# Patient Record
Sex: Female | Born: 1965 | Race: White | Hispanic: No | Marital: Married | Smoking: Former smoker
Health system: Southern US, Community
[De-identification: ages and names within clinical notes are randomized; demographics above are authoritative.]

## PROBLEM LIST (undated history)

## (undated) DIAGNOSIS — E785 Hyperlipidemia, unspecified: Secondary | ICD-10-CM

## (undated) DIAGNOSIS — G4730 Sleep apnea, unspecified: Secondary | ICD-10-CM

## (undated) DIAGNOSIS — Z8619 Personal history of other infectious and parasitic diseases: Secondary | ICD-10-CM

## (undated) DIAGNOSIS — E039 Hypothyroidism, unspecified: Secondary | ICD-10-CM

## (undated) DIAGNOSIS — R7303 Prediabetes: Secondary | ICD-10-CM

## (undated) DIAGNOSIS — R7989 Other specified abnormal findings of blood chemistry: Secondary | ICD-10-CM

## (undated) DIAGNOSIS — N951 Menopausal and female climacteric states: Secondary | ICD-10-CM

## (undated) DIAGNOSIS — Z87442 Personal history of urinary calculi: Secondary | ICD-10-CM

## (undated) DIAGNOSIS — T7840XA Allergy, unspecified, initial encounter: Secondary | ICD-10-CM

## (undated) DIAGNOSIS — Z9889 Other specified postprocedural states: Secondary | ICD-10-CM

## (undated) DIAGNOSIS — R112 Nausea with vomiting, unspecified: Secondary | ICD-10-CM

## (undated) DIAGNOSIS — F419 Anxiety disorder, unspecified: Secondary | ICD-10-CM

## (undated) DIAGNOSIS — L900 Lichen sclerosus et atrophicus: Secondary | ICD-10-CM

## (undated) HISTORY — PX: BREAST SURGERY: SHX581

## (undated) HISTORY — DX: Anxiety disorder, unspecified: F41.9

## (undated) HISTORY — PX: CHOLECYSTECTOMY: SHX55

## (undated) HISTORY — DX: Personal history of other infectious and parasitic diseases: Z86.19

## (undated) HISTORY — DX: Other specified abnormal findings of blood chemistry: R79.89

## (undated) HISTORY — DX: Menopausal and female climacteric states: N95.1

## (undated) HISTORY — PX: LITHOTRIPSY: SUR834

## (undated) HISTORY — DX: Prediabetes: R73.03

## (undated) HISTORY — DX: Lichen sclerosus et atrophicus: L90.0

## (undated) HISTORY — PX: AUGMENTATION MAMMAPLASTY: SUR837

## (undated) HISTORY — PX: COSMETIC SURGERY: SHX468

## (undated) HISTORY — DX: Hyperlipidemia, unspecified: E78.5

## (undated) HISTORY — DX: Allergy, unspecified, initial encounter: T78.40XA

## (undated) HISTORY — PX: WISDOM TOOTH EXTRACTION: SHX21

## (undated) HISTORY — DX: Personal history of urinary calculi: Z87.442

---

## 1997-12-16 ENCOUNTER — Other Ambulatory Visit: Admission: RE | Admit: 1997-12-16 | Discharge: 1997-12-16 | Payer: Self-pay | Admitting: Obstetrics and Gynecology

## 1998-12-23 ENCOUNTER — Other Ambulatory Visit: Admission: RE | Admit: 1998-12-23 | Discharge: 1998-12-23 | Payer: Self-pay | Admitting: Obstetrics and Gynecology

## 2002-08-02 ENCOUNTER — Other Ambulatory Visit: Admission: RE | Admit: 2002-08-02 | Discharge: 2002-08-02 | Payer: Self-pay | Admitting: Obstetrics and Gynecology

## 2003-02-16 HISTORY — PX: NOVASURE ABLATION: SHX5394

## 2003-08-22 ENCOUNTER — Other Ambulatory Visit: Admission: RE | Admit: 2003-08-22 | Discharge: 2003-08-22 | Payer: Self-pay | Admitting: Obstetrics and Gynecology

## 2003-12-06 ENCOUNTER — Ambulatory Visit (HOSPITAL_COMMUNITY): Admission: RE | Admit: 2003-12-06 | Discharge: 2003-12-06 | Payer: Self-pay | Admitting: Obstetrics and Gynecology

## 2003-12-06 ENCOUNTER — Encounter (INDEPENDENT_AMBULATORY_CARE_PROVIDER_SITE_OTHER): Payer: Self-pay | Admitting: *Deleted

## 2004-10-08 ENCOUNTER — Other Ambulatory Visit: Admission: RE | Admit: 2004-10-08 | Discharge: 2004-10-08 | Payer: Self-pay | Admitting: Obstetrics and Gynecology

## 2004-10-27 ENCOUNTER — Ambulatory Visit: Payer: Self-pay | Admitting: Internal Medicine

## 2004-11-06 ENCOUNTER — Ambulatory Visit: Payer: Self-pay | Admitting: Internal Medicine

## 2005-12-08 ENCOUNTER — Other Ambulatory Visit: Admission: RE | Admit: 2005-12-08 | Discharge: 2005-12-08 | Payer: Self-pay | Admitting: Obstetrics and Gynecology

## 2006-01-12 ENCOUNTER — Encounter: Admission: RE | Admit: 2006-01-12 | Discharge: 2006-01-12 | Payer: Self-pay | Admitting: Obstetrics and Gynecology

## 2007-02-24 ENCOUNTER — Encounter: Admission: RE | Admit: 2007-02-24 | Discharge: 2007-02-24 | Payer: Self-pay | Admitting: Obstetrics and Gynecology

## 2008-03-19 ENCOUNTER — Encounter: Admission: RE | Admit: 2008-03-19 | Discharge: 2008-03-19 | Payer: Self-pay | Admitting: Obstetrics and Gynecology

## 2008-06-10 ENCOUNTER — Ambulatory Visit (HOSPITAL_COMMUNITY): Admission: RE | Admit: 2008-06-10 | Discharge: 2008-06-10 | Payer: Self-pay | Admitting: Urology

## 2008-08-22 ENCOUNTER — Ambulatory Visit (HOSPITAL_COMMUNITY): Admission: RE | Admit: 2008-08-22 | Discharge: 2008-08-22 | Payer: Self-pay | Admitting: Urology

## 2008-11-01 ENCOUNTER — Ambulatory Visit (HOSPITAL_COMMUNITY): Admission: RE | Admit: 2008-11-01 | Discharge: 2008-11-01 | Payer: Self-pay | Admitting: Urology

## 2009-02-15 DIAGNOSIS — Z87442 Personal history of urinary calculi: Secondary | ICD-10-CM

## 2009-02-15 HISTORY — DX: Personal history of urinary calculi: Z87.442

## 2009-04-08 ENCOUNTER — Encounter: Admission: RE | Admit: 2009-04-08 | Discharge: 2009-04-08 | Payer: Self-pay | Admitting: Obstetrics and Gynecology

## 2009-09-23 ENCOUNTER — Encounter (INDEPENDENT_AMBULATORY_CARE_PROVIDER_SITE_OTHER): Payer: Self-pay | Admitting: *Deleted

## 2010-03-09 ENCOUNTER — Other Ambulatory Visit: Payer: Self-pay | Admitting: Obstetrics and Gynecology

## 2010-03-09 DIAGNOSIS — Z1239 Encounter for other screening for malignant neoplasm of breast: Secondary | ICD-10-CM

## 2010-03-09 DIAGNOSIS — Z1231 Encounter for screening mammogram for malignant neoplasm of breast: Secondary | ICD-10-CM

## 2010-03-17 NOTE — Letter (Signed)
Summary: Colonoscopy Letter  [facility]  [address] [place], [place] [address]   Phone: [phone]  Fax: [phone]      September 23, 2009 MRN: [id number]   [facility] [address] [place], [place]  [address]   Dear Terri Jensen,   According to your medical record, it is time for you to schedule a Colonoscopy. The American Cancer Society recommends this procedure as a method to detect early colon cancer. Patients with a family history of colon cancer, or a personal history of colon polyps or inflammatory bowel disease are at increased risk.  This letter has beeen generated based on the recommendations made at the time of your procedure. If you feel that in your particular situation this may no longer apply, please contact our office.  Please call our office at [phone] to schedule this appointment or to update your records at your earliest convenience.  Thank you for cooperating with [place] to provide you with the very best care possible.   Sincerely,  Hedwig Morton. Juanda Chance, M.D.  [facility] [phone]

## 2010-04-13 ENCOUNTER — Ambulatory Visit: Payer: Self-pay

## 2010-05-22 LAB — HEMOGLOBIN AND HEMATOCRIT, BLOOD: HCT: 38.4 % (ref 36.0–46.0)

## 2010-05-22 LAB — PREGNANCY, URINE: Preg Test, Ur: NEGATIVE

## 2010-05-24 LAB — PREGNANCY, URINE: Preg Test, Ur: NEGATIVE

## 2010-05-27 LAB — PREGNANCY, URINE: Preg Test, Ur: NEGATIVE

## 2010-07-03 NOTE — H&P (Signed)
Terri Jensen, Terri Jensen                  ACCOUNT NO.:  [id number]   MEDICAL RECORD NO.:  [id number]          PATIENT TYPE:  AMB   LOCATION:  SDC                           FACILITY:  [facility]   PHYSICIAN:  Juluis Mire, M.D.   DATE OF BIRTH:  07-25-1965   DATE OF ADMISSION:  12/06/2003  DATE OF DISCHARGE:                                HISTORY & PHYSICAL   The patient is A 45 year old gravida 2, para 2, married white female, who  presents for a hysteroscopy and Novasure ablation of the endometrium.   In relation to the present admission, cycles are regular.  They are  occurring approximately every 19 to 23 days.  She has one to two days of  extremely heavy flow that is very limiting from the patient's standpoint.  She does have clots and increasing dysmenorrhea.  It is of note her husband  has had a prior vasectomy.  A prior saline infusion ultrasound done on  September 6 was completely unremarkable with findings suggestive of  adenomyosis.  Alternatives have been discussed, including birth control  pills.  These do cause headaches for the patient.  We have discussed the  Mirena IUD versus endometrial ablation versus hysterectomy.  The patient  presents for ablation today.  We will do a hysteroscopic evaluation prior.   In terms of allergies, no known drug allergies.   Medications are none.   PAST MEDICAL HISTORY:  Usual childhood diseases, no significant sequelae.   PAST SURGICAL HISTORY:  Wisdom teeth extracted and previous cholecystectomy.   PAST OBSTETRICAL HISTORY:  She has had one vaginal delivery and one C-  section.   FAMILY HISTORY:  Noncontributory.   SOCIAL HISTORY:  No tobacco or alcohol use.   REVIEW OF SYSTEMS:  Noncontributory.   PHYSICAL EXAMINATION:  VITAL SIGNS:  The patient is afebrile with stable  vital signs.  HEENT:  Patient normocephalic.  Pupils equal, round, and reactive to light  and accommodation.  Extraocular movements were intact, sclerae and  conjunctivae clear.  Oropharynx clear.  NECK:  Without thyromegaly.  BREASTS:  Bilateral implants but no discrete masses.  CHEST:  Lungs clear.  CARDIAC:  Regular rhythm and rate, no murmurs or gallops.  ABDOMEN:  Benign.  No mass, organomegaly, or tenderness.  PELVIC:  Normal external genitalia.  Vaginal mucosa is clear.  Cervix  unremarkable.  Uterus upper limits of normal size.  Adnexa unremarkable.  EXTREMITIES:  Trace edema.  NEUROLOGIC:  Grossly within normal limits.   IMPRESSION:  Menorrhagia.  Probable adenomyosis.   PLAN:  The patient will undergo hysteroscopy with subsequent Novasure  ablation.  Risks of surgery have been discussed, including the risk of  infection; the risk of vascular injury that could lead to hemorrhage  requiring transfusion or possible hysterectomy; risk of perforation that  could lead to injury to adjacent organs such as bowel, requiring exploratory  surgery; the risk of deep venous thrombosis and pulmonary embolus.  Success  rates of 80-90% are quoted.      JSM/MEDQ  D:  12/06/2003  T:  12/06/2003  Job:  [id number] 

## 2010-07-03 NOTE — Op Note (Signed)
NAMEENYLA, LISBON                  ACCOUNT NO.:  [id number]   MEDICAL RECORD NO.:  [id number]          PATIENT TYPE:  AMB   LOCATION:  [facility]C                           [facility]ILITY:  [facility]   PHYSICIAN:  Juluis Mire, M.D.   DATE OF BIRTH:  March 03, 1965   DATE OF PROCEDURE:  12/06/2003  DATE OF DISCHARGE:                                 OPERATIVE REPORT   PREOPERATIVE DIAGNOSIS:  Menorrhagia.   POSTOPERATIVE DIAGNOSIS:  Menorrhagia.   OPERATIVE PROCEDURE:  1.  Paracervical block.  2.  Hysteroscopy with biopsies.  3.  Endometrial ablation using NovaSure.   SURGEON:  Dr. Richardean Chimera.   ANESTHESIA:  Paracervical block and sedation.   ESTIMATED BLOOD LOSS:  Minimal.   PACKS AND DRAINS:  None.   INTRAOPERATIVE BLOOD REPLACEMENT:  None.   COMPLICATIONS:  None.   INDICATIONS:  Are noted in the History and Physical.   DESCRIPTION OF PROCEDURE:  The patient was taken to the OR and placed in the  supine position.  After a satisfactory level of sedation, the patient was  placed in the dorsal lithotomy position using the Allen stirrups.  The  patient was draped in a sterile field.  A warm speculum was placed in the  vaginal vault.  The cervix and vagina were cleansed with Hibiclens.  A  paracervical block was instituted using 1% Nesacaine.  The cervix was  secured with a single tooth tenaculum.  The uterus sounded to approximately  10 cm.  Endocervical length was determined using the Hegar dilator, was  approximately 5.5 cm.  The cervix was serially dilated to a size 35 Pratt  dilator.  The operative hysteroscope was then introduced.  Shaggy  endometrium was noted and removed.  The endometrial cavity was otherwise  noted to be clear.  Both tubal ostia were visualized and noted to be normal.  No signs of perforation or any intrauterine pathology.  Hysteroscopy was  discontinued at this point in time.  There was a leakage.  Reported deficit  was 500 mL; calculated deficit was 200 mL.  At this  point in time, the  NovaSure endometrial ablation device was put in place.  The cavity width was  measured at 4.5.  CO2 test passed.  We ablated for 1 minute 16 seconds with  a power of 136.  The NovaSure was removed intact.  There was no active  bleeding or signs of complication.  The  single tooth tenaculum and speculum were then removed.  The patient was  taken out of the dorsal lithotomy position, once alert transferred to  recovery in good condition.  Sponge, instrument, and needle counts reported  as correct by circulating nurse.      JSM/MEDQ  D:  12/06/2003  T:  12/06/2003  Job:  [id number]

## 2010-09-04 ENCOUNTER — Ambulatory Visit
Admission: RE | Admit: 2010-09-04 | Discharge: 2010-09-04 | Disposition: A | Discharge status: Home / Self Care | Payer: BC Managed Care – PPO | Source: Ambulatory Visit | Attending: Obstetrics and Gynecology | Admitting: Obstetrics and Gynecology

## 2010-09-04 DIAGNOSIS — Z1231 Encounter for screening mammogram for malignant neoplasm of breast: Secondary | ICD-10-CM

## 2011-05-29 ENCOUNTER — Ambulatory Visit (INDEPENDENT_AMBULATORY_CARE_PROVIDER_SITE_OTHER): Payer: BC Managed Care – PPO | Admitting: Family Medicine

## 2011-05-29 VITALS — BP 119/86 | HR 76 | Temp 98.0°F | Resp 16 | Ht 64.5 in | Wt 193.4 lb

## 2011-05-29 DIAGNOSIS — B029 Zoster without complications: Secondary | ICD-10-CM

## 2011-05-29 MED ORDER — VALACYCLOVIR HCL 1 G PO TABS: 1000.0000 mg | ORAL_TABLET | Freq: Three times a day (TID) | ORAL | Status: AC

## 2011-05-29 NOTE — Progress Notes (Signed)
  Subjective:    Patient ID: Terri Jensen, female    DOB: 1965-10-14, 46 y.o.   MRN: [id number]  HPI 46 yo female here with pain/rash and concern for shingles.  Had stress fracture and was in boot.  Had boot removed 2 weeks ago.  Played tennis for first time last weekend.  Pain started Tuesday but didn't really feel like muscle soreness.  More burning/stabbing/shooting type pain.  Buttock/inner thigh to inside ankle on left.  Thursday, group of sores on ankle.  Yesterday, group of sores on inner knee.     Review of Systems Negative except as per HPI     Objective:   Physical Exam  Constitutional: She appears well-developed.  Pulmonary/Chest: Effort normal.  Neurological: She is alert.   Over medial malleolus on left, grouped vesicles on erythematous base.  Same on medial knee.       Assessment & Plan:  Shingles.  Valtrex.  Pain controlled by advil presently.

## 2011-07-29 DIAGNOSIS — N200 Calculus of kidney: Secondary | ICD-10-CM | POA: Insufficient documentation

## 2011-08-12 ENCOUNTER — Ambulatory Visit (INDEPENDENT_AMBULATORY_CARE_PROVIDER_SITE_OTHER): Payer: BC Managed Care – PPO | Admitting: Obstetrics and Gynecology

## 2011-08-12 ENCOUNTER — Encounter: Payer: Self-pay | Admitting: Obstetrics and Gynecology

## 2011-08-12 VITALS — BP 102/60 | HR 88 | Ht 65.0 in | Wt 191.0 lb

## 2011-08-12 DIAGNOSIS — Z124 Encounter for screening for malignant neoplasm of cervix: Secondary | ICD-10-CM

## 2011-08-12 DIAGNOSIS — Z01419 Encounter for gynecological examination (general) (routine) without abnormal findings: Secondary | ICD-10-CM

## 2011-08-12 NOTE — Progress Notes (Signed)
The patient reports no complaints  Contraception:vasectomy  Last mammogram: normal, July 2012 Last pap: was normal and not applicable June  2012  GC/Chlamydia cultures offered: declined HIV/RPR/HbsAg offered:  declined HSV 1 and 2 glycoprotein offered: declined  Menstrual cycle regular and monthly: No: Pt states it is starting to become irregular Menstrual flow normal: Yes  Urinary symptoms: none Normal bowel movements: Yes Reports abuse at home: No  Subjective:    Terri Jensen is a 46 y.o. female, G2P2, who presents for an annual exam. S/P Novasure 2005    History   Social History  . Marital Status: Married    Spouse Name: N/A    Number of Children: N/A  . Years of Education: N/A   Social History Main Topics  . Smoking status: Never Smoker   . Smokeless tobacco: Never Used  . Alcohol Use: 1.0 oz/week    2 drink(s) per week  . Drug Use: No  . Sexually Active: Yes    Birth Control/ Protection: Surgical     VAS   Other Topics Concern  . None   Social History Narrative  . None    Menstrual cycle:   LMP: Patient's last menstrual period was 08/02/2011.           Cycle: cycle occasionnally skips/ 4 days/ normal and much better since Novasure  The following portions of the patient's history were reviewed and updated as appropriate: allergies, current medications, past family history, past medical history, past social history, past surgical history and problem list.  Review of Systems Pertinent items are noted in HPI. Breast:Negative for breast lump,nipple discharge or nipple retraction Gastrointestinal: Negative for abdominal pain, change in bowel habits or rectal bleeding Urinary:negative   Objective:    BP 102/60  Pulse 88  Ht 5\' 5"  (1.651 m)  Wt 191 lb (86.637 kg)  BMI 31.78 kg/m2  LMP 08/02/2011    Weight:  Wt Readings from Last 1 Encounters:  08/12/11 191 lb (86.637 kg)          BMI: Body mass index is 31.78 kg/(m^2).  General Appearance: Alert,  appropriate appearance for age. No acute distress HEENT: Grossly normal Neck / Thyroid: Supple, no masses, nodes or enlargement Lungs: clear to auscultation bilaterally Back: No CVA tenderness Breast Exam: No dimpling, nipple retraction or discharge. No masses or nodes., Normal to inspection and No masses or nodes.No dimpling, nipple retraction or discharge. Cardiovascular: Regular rate and rhythm. S1, S2, no murmur Gastrointestinal: Soft, non-tender, no masses or organomegaly Pelvic Exam: Vulva and vagina appear normal. Bimanual exam reveals normal uterus and adnexa. Rectovaginal: normal rectal, no masses Lymphatic Exam: Non-palpable nodes in neck, clavicular, axillary, or inguinal regions Skin: no rash or abnormalities Neurologic: Normal gait and speech, no tremor  Psychiatric: Alert and oriented, appropriate affect.   Wet Prep:not applicable Urinalysis:not applicable UPT: Not done   Assessment:    Normal gyn exam    Plan:    mammogram pap smear return annually or prn STD screening: declined Contraception:no method      [facility]

## 2011-08-13 LAB — PAP IG W/ RFLX HPV ASCU

## 2011-10-19 ENCOUNTER — Encounter: Payer: Self-pay | Admitting: Internal Medicine

## 2012-11-07 ENCOUNTER — Encounter: Payer: Self-pay | Admitting: Internal Medicine

## 2013-01-22 ENCOUNTER — Ambulatory Visit (AMBULATORY_SURGERY_CENTER): Payer: Self-pay

## 2013-01-22 VITALS — Ht 65.0 in | Wt 190.0 lb

## 2013-01-22 DIAGNOSIS — Z800 Family history of malignant neoplasm of digestive organs: Secondary | ICD-10-CM

## 2013-01-22 MED ORDER — MOVIPREP 100 G PO SOLR: 1.0000 | Freq: Once | ORAL | Status: DC

## 2013-01-24 ENCOUNTER — Encounter: Payer: Self-pay | Admitting: Internal Medicine

## 2013-02-01 ENCOUNTER — Telehealth: Payer: Self-pay | Admitting: Internal Medicine

## 2013-02-01 NOTE — Telephone Encounter (Signed)
When was her procedure?

## 2013-02-02 NOTE — Telephone Encounter (Signed)
Don't charge,thanx

## 2013-02-02 NOTE — Telephone Encounter (Signed)
She was scheduled for Monday 02-05-13 and called to cancelled on Thursday 02-01-13. She was supposed to call on Wednesday by 5:00p.m. to cancel to avoid being charged. Do you want to charge?

## 2013-02-05 ENCOUNTER — Encounter: Payer: BC Managed Care – PPO | Admitting: Internal Medicine

## 2013-09-04 ENCOUNTER — Other Ambulatory Visit: Payer: Self-pay

## 2013-09-04 DIAGNOSIS — Z1231 Encounter for screening mammogram for malignant neoplasm of breast: Secondary | ICD-10-CM

## 2013-09-19 ENCOUNTER — Ambulatory Visit
Admission: RE | Admit: 2013-09-19 | Discharge: 2013-09-19 | Disposition: A | Discharge status: Home / Self Care | Payer: BC Managed Care – PPO | Source: Ambulatory Visit

## 2013-09-19 ENCOUNTER — Encounter (INDEPENDENT_AMBULATORY_CARE_PROVIDER_SITE_OTHER): Payer: Self-pay

## 2013-09-19 DIAGNOSIS — Z1231 Encounter for screening mammogram for malignant neoplasm of breast: Secondary | ICD-10-CM

## 2013-10-08 ENCOUNTER — Other Ambulatory Visit: Payer: Self-pay | Admitting: Dermatology

## 2014-05-16 ENCOUNTER — Other Ambulatory Visit (HOSPITAL_COMMUNITY): Payer: Self-pay | Admitting: Urology

## 2014-05-16 DIAGNOSIS — N2889 Other specified disorders of kidney and ureter: Secondary | ICD-10-CM

## 2014-05-20 ENCOUNTER — Other Ambulatory Visit (HOSPITAL_COMMUNITY): Payer: Self-pay | Admitting: Urology

## 2014-05-20 DIAGNOSIS — N2889 Other specified disorders of kidney and ureter: Secondary | ICD-10-CM

## 2014-05-22 ENCOUNTER — Other Ambulatory Visit (HOSPITAL_COMMUNITY): Payer: Self-pay

## 2014-06-10 ENCOUNTER — Ambulatory Visit
Admission: RE | Admit: 2014-06-10 | Discharge: 2014-06-10 | Disposition: A | Discharge status: Home / Self Care | Payer: BLUE CROSS/BLUE SHIELD | Source: Ambulatory Visit | Attending: Urology | Admitting: Urology

## 2014-06-10 DIAGNOSIS — N2889 Other specified disorders of kidney and ureter: Secondary | ICD-10-CM

## 2014-06-10 MED ORDER — GADOBENATE DIMEGLUMINE 529 MG/ML IV SOLN
18.0000 mL | Freq: Once | INTRAVENOUS | Status: AC | PRN
  Administered 2014-06-10: 18 mL via INTRAVENOUS

## 2014-08-15 ENCOUNTER — Other Ambulatory Visit: Payer: Self-pay

## 2014-08-15 ENCOUNTER — Encounter: Payer: Self-pay | Admitting: Internal Medicine

## 2014-08-15 DIAGNOSIS — Z9882 Breast implant status: Secondary | ICD-10-CM

## 2014-08-15 DIAGNOSIS — Z1231 Encounter for screening mammogram for malignant neoplasm of breast: Secondary | ICD-10-CM

## 2014-09-23 ENCOUNTER — Ambulatory Visit
Admission: RE | Admit: 2014-09-23 | Discharge: 2014-09-23 | Disposition: A | Discharge status: Home / Self Care | Payer: BLUE CROSS/BLUE SHIELD | Source: Ambulatory Visit

## 2014-09-23 DIAGNOSIS — Z9882 Breast implant status: Secondary | ICD-10-CM

## 2014-09-23 DIAGNOSIS — Z1231 Encounter for screening mammogram for malignant neoplasm of breast: Secondary | ICD-10-CM

## 2014-11-07 ENCOUNTER — Encounter: Payer: BLUE CROSS/BLUE SHIELD | Admitting: Internal Medicine

## 2015-05-05 DIAGNOSIS — Z411 Encounter for cosmetic surgery: Secondary | ICD-10-CM | POA: Insufficient documentation

## 2015-08-26 ENCOUNTER — Other Ambulatory Visit: Payer: Self-pay | Admitting: Obstetrics and Gynecology

## 2015-08-26 DIAGNOSIS — Z9882 Breast implant status: Secondary | ICD-10-CM

## 2015-08-26 DIAGNOSIS — Z1231 Encounter for screening mammogram for malignant neoplasm of breast: Secondary | ICD-10-CM

## 2015-09-24 ENCOUNTER — Ambulatory Visit
Admission: RE | Admit: 2015-09-24 | Discharge: 2015-09-24 | Disposition: A | Discharge status: Home / Self Care | Payer: BLUE CROSS/BLUE SHIELD | Source: Ambulatory Visit | Attending: Obstetrics and Gynecology | Admitting: Obstetrics and Gynecology

## 2015-09-24 DIAGNOSIS — Z1231 Encounter for screening mammogram for malignant neoplasm of breast: Secondary | ICD-10-CM

## 2015-09-24 DIAGNOSIS — Z9882 Breast implant status: Secondary | ICD-10-CM

## 2015-09-25 LAB — LIPID PANEL
CHOLESTEROL: 203 mg/dL — AB (ref 0–200)
HDL: 73 mg/dL — AB (ref 35–70)
LDL CALC: 116 mg/dL
LDl/HDL Ratio: 1.6
TRIGLYCERIDES: 71 mg/dL (ref 40–160)

## 2015-09-25 LAB — VITAMIN D 25 HYDROXY (VIT D DEFICIENCY, FRACTURES): VIT D 25 HYDROXY: 30.4

## 2016-04-04 ENCOUNTER — Encounter: Payer: Self-pay | Admitting: Family Medicine

## 2016-04-04 ENCOUNTER — Other Ambulatory Visit: Payer: Self-pay | Admitting: Family Medicine

## 2016-04-05 ENCOUNTER — Encounter: Payer: Self-pay | Admitting: Family Medicine

## 2016-04-05 ENCOUNTER — Ambulatory Visit (INDEPENDENT_AMBULATORY_CARE_PROVIDER_SITE_OTHER): Payer: BLUE CROSS/BLUE SHIELD | Admitting: Family Medicine

## 2016-04-05 VITALS — BP 132/88 | HR 95 | Temp 97.6°F | Ht 63.39 in | Wt 215.8 lb

## 2016-04-05 DIAGNOSIS — N951 Menopausal and female climacteric states: Secondary | ICD-10-CM

## 2016-04-05 DIAGNOSIS — E785 Hyperlipidemia, unspecified: Secondary | ICD-10-CM | POA: Diagnosis not present

## 2016-04-05 DIAGNOSIS — E559 Vitamin D deficiency, unspecified: Secondary | ICD-10-CM | POA: Diagnosis not present

## 2016-04-05 DIAGNOSIS — E88819 Insulin resistance, unspecified: Secondary | ICD-10-CM | POA: Insufficient documentation

## 2016-04-05 DIAGNOSIS — R7303 Prediabetes: Secondary | ICD-10-CM | POA: Diagnosis not present

## 2016-04-05 DIAGNOSIS — E782 Mixed hyperlipidemia: Secondary | ICD-10-CM | POA: Insufficient documentation

## 2016-04-05 DIAGNOSIS — R7989 Other specified abnormal findings of blood chemistry: Secondary | ICD-10-CM | POA: Insufficient documentation

## 2016-04-05 DIAGNOSIS — R5383 Other fatigue: Secondary | ICD-10-CM

## 2016-04-05 DIAGNOSIS — E669 Obesity, unspecified: Secondary | ICD-10-CM

## 2016-04-05 DIAGNOSIS — E8881 Metabolic syndrome: Secondary | ICD-10-CM | POA: Insufficient documentation

## 2016-04-05 LAB — COMPREHENSIVE METABOLIC PANEL
ALT: 34 U/L (ref 0–35)
AST: 21 U/L (ref 0–37)
Albumin: 4.5 g/dL (ref 3.5–5.2)
Alkaline Phosphatase: 83 U/L (ref 39–117)
BUN: 15 mg/dL (ref 6–23)
CO2: 25 mEq/L (ref 19–32)
Calcium: 9.2 mg/dL (ref 8.4–10.5)
Chloride: 106 mEq/L (ref 96–112)
Creatinine, Ser: 0.72 mg/dL (ref 0.40–1.20)
GFR: 90.67 mL/min (ref >60.00)
Glucose, Bld: 101 mg/dL — ABNORMAL HIGH (ref 70–99)
Potassium: 4.1 mEq/L (ref 3.5–5.1)
Sodium: 138 mEq/L (ref 135–145)
Total Bilirubin: 0.3 mg/dL (ref 0.2–1.2)
Total Protein: 7 g/dL (ref 6.0–8.3)

## 2016-04-05 LAB — TSH: TSH: 5.06 u[IU]/mL — ABNORMAL HIGH (ref 0.35–4.50)

## 2016-04-05 LAB — CBC
HCT: 42.4 % (ref 36.0–46.0)
Hemoglobin: 14.5 g/dL (ref 12.0–15.0)
MCHC: 34.2 g/dL (ref 30.0–36.0)
MCV: 93.1 fl (ref 78.0–100.0)
Platelets: 216 10*3/uL (ref 150.0–400.0)
RBC: 4.55 Mil/uL (ref 3.87–5.11)
RDW: 12.8 % (ref 11.5–15.5)
WBC: 4.8 10*3/uL (ref 4.0–10.5)

## 2016-04-05 LAB — HEMOGLOBIN A1C: Hgb A1c MFr Bld: 5.9 % (ref 4.6–6.5)

## 2016-04-05 LAB — T4, FREE: Free T4: 0.68 ng/dL (ref 0.60–1.60)

## 2016-04-05 LAB — VITAMIN D 25 HYDROXY (VIT D DEFICIENCY, FRACTURES): VITD: 28.1 ng/mL — ABNORMAL LOW (ref 30.00–100.00)

## 2016-04-05 NOTE — Progress Notes (Signed)
Pre visit review using our clinic review tool, if applicable. No additional management support is needed unless otherwise documented below in the visit note. 

## 2016-04-05 NOTE — Progress Notes (Signed)
Terri Jensen is a 51 y.o. female is here to [facility].   History of Present Illness:    1. Fatigue, unspecified type. Generalized. Can sleep throughout the night, 11 hours, and feel tired in the morning. Can want a nap in the afternoon. Husband says that she is starting to snore. No exercise. Works at a [employer], front office. Two adult children. Drinks 3 diet sodas per day. Hx of low vitamin D. Hx of HLD. Denies ETOH, tobacco, drug use, supplements. Going through perimenopause. Denies depression.   2. Low vitamin D level. S/p high dose supplementation last year. Taking 1000 IU daily now. Repeat was 30 last year.     3. Prediabetes. Elevated A1c in 2016. No follow up since then. Weight gain of 20 pounds in the past year.    4. Perimenopause. On Prozac for mood swings. Doing well. Wants to continue.    5. Obesity (BMI 35.0-39.9 without comorbidity). Does not watch diet or exercise.     Health Maintenance Due  Topic Date Due  . TETANUS/TDAP  01/02/1984    PMHx, SurgHx, SocialHx, Medications, and Allergies were reviewed in the Visit Navigator and updated as appropriate.    Past Medical History:  Diagnosis Date  . History of kidney stones   . History of shingles   . History of varicella   . HLD (hyperlipidemia)   . Low vitamin D level   . Perimenopause   . Prediabetes     Past Surgical History:  Procedure Laterality Date  . BREAST SURGERY    . CESAREAN SECTION    . CHOLECYSTECTOMY    . [place]  2005  . WISDOM TOOTH EXTRACTION      Family History  Problem Relation Age of Onset  . Heart disease Mother   . COPD Mother     Emphysema  . Thyroid disease Mother   . Hypertension Mother   . Diabetes Mother   . Stroke Mother   . Breast cancer Mother   . Heart disease Father   . COPD Father     Emphysema  . Hypertension Father   . Diabetes Father   . Kidney disease Father   . Liver disease Father   . Hypertension Brother   . Cancer Paternal  Grandmother 15    colon  . Colon cancer Paternal Grandmother     Social History  Substance Use Topics  . Smoking status: Former Smoker    Quit date: 04/23/1990  . Smokeless tobacco: Never Used  . Alcohol use 1.0 oz/week    2 Standard drinks or equivalent per week     Current Medications and Allergies:    Current Outpatient Prescriptions:  .  tretinoin (RETIN-A) 0.1 % cream, APPLY TO AFFECTED AREA EVERY DAY, Disp: , Rfl:  .  FLUoxetine (PROZAC) 20 MG capsule, Take 20 mg by mouth daily., Disp: , Rfl:  .  rosuvastatin (CRESTOR) 5 MG tablet, Take 5 mg by mouth daily., Disp: , Rfl:    Allergies  Allergen Reactions  . Shellfish Allergy Anaphylaxis  . Adhesive [Tape] Rash      Patient Information Form: Screening and ROS    Do you feel safe in relationships? yes PHQ-2: negative  Review of Systems  General:  Negative for unexplained weight loss, fever Skin: Negative for new or changing mole, sore that won't heal HEENT: Negative for trouble hearing, trouble seeing, ringing in ears, mouth sores, hoarseness, change in voice, dysphagia CV:  Negative for  chest pain, dyspnea, edema, palpitations Resp: Negative for cough, dyspnea, hemoptysis GI: Negative for nausea, vomiting, diarrhea, constipation, abdominal pain, melena, hematochezia GU: Negative for dysuria, incontinence, urinary hesitance, hematuria MSK: Negative for muscle cramps or aches, joint pain or swelling Neuro: Negative for headaches, weakness, numbness, dizziness, passing out/fainting Psych: Negative for depression, anxiety, memory problems   Vitals:   Vitals:   04/05/16 0910  BP: 132/88  Pulse: 95  Temp: 97.6 F (36.4 C)  TempSrc: Oral  SpO2: 98%  Weight: 215 lb 12.8 oz (97.9 kg)  Height: 5' 3.39" (1.61 m)     Body mass index is 37.76 kg/m.   Physical Exam:     General: Alert, cooperative, appears stated age and no distress.  HEENT:  Normocephalic, without obvious abnormality, atraumatic.  Conjunctivae/corneas clear. PERRL, EOM's intact. Normal TM's and external ear canals both ears. Nares normal. Septum midline. Mucosa normal. No drainage or sinus tenderness. Lips, mucosa, and tongue normal; teeth and gums normal.  Lungs: Clear to auscultation bilaterally.  Heart:: Regular rate and rhythm, S1, S2 normal, no murmur, click, rub or gallop.  Abdomen: Soft, non-tender; bowel sounds normal; no masses,  no organomegaly.  Extremities: Extremities normal, atraumatic, no cyanosis or edema.  Pulses: 2+ and symmetric.  Skin: Skin color, texture, turgor normal. No rashes or lesions.  Neurologic: Alert and oriented X 3, normal strength and tone. Normal symmetric. reflexes. Normal coordination and gait.  Psych: Alert,oriented, in NAD with a full range of affect, normal behavior and no psychotic features      Assessment and Plan:    Ami was seen today for fatigue.  Diagnoses and all orders for this visit:  Fatigue, unspecified type Comments: Labs pending. Pateint snores. WIll consider sleep study. Reviewed improtance of exercise, healthy food choices, stress reduction.  Orders: -     CBC -     TSH -     T4, free  Low vitamin D level Comments: Supplement daily 2000 - 4000 IU daily.  Orders: -     Vitamin D (25 hydroxy)  Prediabetes Comments: A1c 5.9 in 2016. Has gained about 20 pounds since that time. Recheck, will add insulin level as well. Orders: -     Hemoglobin A1c -     Comprehensive metabolic panel -     Insulin, Free (Bioactive)  Perimenopause Comments: Continue Prozac for mood swings.   Obesity (BMI 35.0-39.9 without comorbidity) Comments: Healthy lifestyle choices discussed.   . Reviewed expectations re: course of current medical issues. . Discussed self-management of symptoms. . Outlined signs and symptoms indicating need for more acute intervention. . Patient verbalized understanding and all questions were answered. . See orders for this visit as  documented in the electronic medical record. . Patient received an After Visit Summary.  Records requested if needed. I spent 45 minutes with this patient, greater than 50% was face-to-face time counseling regarding the above diagnoses.    Briscoe Deutscher, [place], [place] 04/05/2016   Follow-up: Return in about 3 months (around 07/03/2016).  Meds ordered this encounter  Medications  . tretinoin (RETIN-A) 0.1 % cream    Sig: APPLY TO AFFECTED AREA EVERY DAY   There are no discontinued medications. Orders Placed This Encounter  Procedures  . Hemoglobin A1c  . CBC  . Comprehensive metabolic panel  . TSH  . T4, free  . Vitamin D (25 hydroxy)  . Insulin, Free (Bioactive)

## 2016-04-06 ENCOUNTER — Telehealth: Payer: Self-pay | Admitting: Family Medicine

## 2016-04-06 MED ORDER — CHOLECALCIFEROL 1.25 MG (50000 UT) PO TABS: ORAL_TABLET | ORAL | 0 refills | Status: DC

## 2016-04-06 MED ORDER — METFORMIN HCL 500 MG PO TABS: 500.0000 mg | ORAL_TABLET | Freq: Two times a day (BID) | ORAL | 3 refills | Status: DC

## 2016-04-06 NOTE — Addendum Note (Signed)
Addended by: Briscoe Deutscher R on: 04/06/2016 10:48 AM   Modules accepted: Orders

## 2016-04-06 NOTE — Addendum Note (Signed)
Addended by: Briscoe Deutscher R on: 04/06/2016 10:47 AM   Modules accepted: Orders

## 2016-04-06 NOTE — Telephone Encounter (Signed)
Patient thinks she missed a call from [employer].  I was unable to identify a call from our office.  Thank you,  -LL

## 2016-04-07 NOTE — Telephone Encounter (Signed)
Spoke with patient.  Answered question regarding Metformin and TSH.  Per Dr. Juleen China, Metformin may have a protective effect on thyroid.  Patient verbalized understanding.

## 2016-04-08 LAB — INSULIN, FREE (BIOACTIVE): Insulin, Free: 11.2 u[IU]/mL (ref 1.5–14.9)

## 2016-04-11 ENCOUNTER — Encounter: Payer: Self-pay | Admitting: Family Medicine

## 2016-05-11 ENCOUNTER — Encounter: Payer: Self-pay | Admitting: Family Medicine

## 2016-05-12 ENCOUNTER — Other Ambulatory Visit: Payer: Self-pay | Admitting: Surgical

## 2016-05-12 MED ORDER — ROSUVASTATIN CALCIUM 5 MG PO TABS: 5.0000 mg | ORAL_TABLET | Freq: Every day | ORAL | 1 refills | Status: DC

## 2016-07-26 ENCOUNTER — Ambulatory Visit: Payer: BLUE CROSS/BLUE SHIELD | Admitting: Family Medicine

## 2016-08-04 ENCOUNTER — Ambulatory Visit (INDEPENDENT_AMBULATORY_CARE_PROVIDER_SITE_OTHER): Payer: BLUE CROSS/BLUE SHIELD | Admitting: Family Medicine

## 2016-08-04 ENCOUNTER — Encounter: Payer: Self-pay | Admitting: Family Medicine

## 2016-08-04 VITALS — BP 134/82 | HR 85 | Temp 98.2°F | Ht 63.0 in | Wt 214.6 lb

## 2016-08-04 DIAGNOSIS — R5383 Other fatigue: Secondary | ICD-10-CM

## 2016-08-04 DIAGNOSIS — Z23 Encounter for immunization: Secondary | ICD-10-CM

## 2016-08-04 DIAGNOSIS — E669 Obesity, unspecified: Secondary | ICD-10-CM

## 2016-08-04 DIAGNOSIS — E039 Hypothyroidism, unspecified: Secondary | ICD-10-CM

## 2016-08-04 LAB — T4, FREE: Free T4: 0.49 ng/dL — ABNORMAL LOW (ref 0.60–1.60)

## 2016-08-04 LAB — TSH: TSH: 4.88 u[IU]/mL — ABNORMAL HIGH (ref 0.35–4.50)

## 2016-08-04 NOTE — Progress Notes (Signed)
Terri Jensen is a 51 y.o. female is here for follow up.  History of Present Illness:   Water quality scientist, CMA, acting as scribe for Dr. Juleen Jensen.  HPI   Prediabetes: Current symptoms: no polyuria or polydipsia, no chest pain, dyspnea or TIA's, no numbness, tingling or pain in extremities.  Taking medication compliantly without noted sided effects [x]   YES  []   NO  Maintaining a diabetic diet? [x]   YES  []   NO Trying to exercise on a regular basis? []   YES  [x]   NO  Lab Results  Component Value Date   HGBA1C 5.9 04/05/2016    No results found for: Terri Jensen   Lab Results  Component Value Date   CHOL 203 (A) 09/25/2015   HDL 73 (A) 09/25/2015   LDLCALC 116 09/25/2015   TRIG 71 09/25/2015     Wt Readings from Last 3 Encounters:  08/04/16 214 lb 9.6 oz (97.3 kg)  04/05/16 215 lb 12.8 oz (97.9 kg)  01/22/13 190 lb (86.2 kg)   Reports that she quit smoking about 26 years ago. She has a 7.00 pack-year smoking history.   BP Readings from Last 3 Encounters:  08/04/16 134/82  04/05/16 132/88  08/12/11 102/60   Lab Results  Component Value Date   CREATININE 0.72 04/05/2016   Abnormal Thyroid Labs Here for recheck of thyroid. Fatigue.  There are no preventive care reminders to display for this patient.  PMHx, SurgHx, SocialHx, FamHx, Medications, and Allergies were reviewed in the Visit Navigator and updated as appropriate.   Patient Active Problem List   Diagnosis Date Noted  . Low vitamin D level 04/05/2016  . Prediabetes 04/05/2016  . Perimenopause 04/05/2016  . Obesity (BMI 35.0-39.9 without comorbidity) 04/05/2016  . Hyperlipidemia 04/05/2016  . Encounter for cosmetic surgery 05/05/2015  . Kidney stones    Social History  Substance Use Topics  . Smoking status: Former Smoker    Packs/day: 1.00    Years: 7.00    Types: Cigarettes    Quit date: 04/23/1990  . Smokeless tobacco: Never Used  . Alcohol use 2.4 - 3.0 oz/week    2 - 3 Glasses of wine, 2  Standard drinks or equivalent per week   Current Medications and Allergies:   .  Cholecalciferol 50000 units TABS, 50,000 units PO qwk for 8 weeks., Disp: 8 tablet, Rfl: 0 .  FLUoxetine (PROZAC) 20 MG capsule, Take 20 mg by mouth daily., Disp: , Rfl:  .  metFORMIN (GLUCOPHAGE) 500 MG tablet, Take 1 tablet (500 mg total) by mouth 2 (two) times daily with a meal. (Patient taking differently: Take 500 mg by mouth at bedtime. ), Disp: 180 tablet, Rfl: 3 .  rosuvastatin (CRESTOR) 5 MG tablet, Take 1 tablet (5 mg total) by mouth daily., Disp: 90 tablet, Rfl: 1 .  tretinoin (RETIN-A) 0.1 % cream, APPLY TO AFFECTED AREA EVERY DAY, Disp: , Rfl:   Allergies  Allergen Reactions  . Shellfish Allergy Anaphylaxis  . Adhesive [Tape] Rash  . Codeine Nausea And Vomiting   Review of Systems   Review of Systems  Constitutional: Positive for malaise/fatigue. Negative for chills, fever and weight loss.  Respiratory: Negative for cough, shortness of breath and wheezing.   Cardiovascular: Negative for chest pain, palpitations and leg swelling.  Gastrointestinal: Negative for abdominal pain, constipation, diarrhea, nausea and vomiting.  Genitourinary: Negative for dysuria and urgency.  Musculoskeletal: Negative for joint pain and myalgias.  Skin: Negative for rash.  Neurological: Negative  for dizziness and headaches.  Psychiatric/Behavioral: Negative for depression, substance abuse and suicidal ideas. The patient is not nervous/anxious.     Vitals:   Vitals:   08/04/16 0943  BP: 134/82  Pulse: 85  Temp: 98.2 F (36.8 C)  TempSrc: Oral  SpO2: 96%  Weight: 214 lb 9.6 oz (97.3 kg)  Height: 5\' 3"  (1.6 m)     Body mass index is 38.01 kg/m.   Physical Exam:   Physical Exam  Constitutional: She appears well-developed and well-nourished. No distress.  HENT:  Head: Normocephalic and atraumatic.  Eyes: EOM are normal. Pupils are equal, round, and reactive to light.  Neck: Normal range of motion.  Neck supple.  Cardiovascular: Normal rate, regular rhythm, normal heart sounds and intact distal pulses.   Pulmonary/Chest: Effort normal.  Abdominal: Soft.  Skin: Skin is warm.  Psychiatric: She has a normal mood and affect. Her behavior is normal.  Nursing note and vitals reviewed.    Results for orders placed or performed in visit on 08/04/16  TSH  Result Value Ref Range   TSH 4.88 (H) 0.35 - 4.50 uIU/mL  T4, free  Result Value Ref Range   Free T4 0.49 (L) 0.60 - 1.60 ng/dL   Assessment and Plan:   Terri Jensen was seen today for follow-up, fatigue and diabetes.  Diagnoses and all orders for this visit:  Fatigue, unspecified type -     Home sleep test; Future -     TSH -     T4, free  Need for Tdap vaccination -     Tdap vaccine greater than or equal to 7yo IM  Obesity (BMI 35.0-39.9 without comorbidity) Comments: The patient is asked to make an attempt to improve diet and exercise patterns to aid in medical management of this problem.  Hypothyroidism, unspecified type -     levothyroxine (SYNTHROID, LEVOTHROID) 25 MCG tablet; Take 1 tablet (25 mcg total) by mouth daily before breakfast.   . Reviewed expectations re: course of current medical issues. . Discussed self-management of symptoms. . Outlined signs and symptoms indicating need for more acute intervention. . Patient verbalized understanding and all questions were answered. Marland Kitchen Health Maintenance issues including appropriate healthy diet, exercise, and smoking avoidance were discussed with patient. . See orders for this visit as documented in the electronic medical record. . Patient received an After Visit Summary.  CMA served as Education administrator during this visit. History, Physical, and Plan performed by medical provider. The above documentation has been reviewed and is accurate and complete. Terri Jensen, D.O.  Terri Deutscher, DO [place] 08/05/2016  No future appointments.

## 2016-08-05 DIAGNOSIS — E039 Hypothyroidism, unspecified: Secondary | ICD-10-CM | POA: Insufficient documentation

## 2016-08-05 DIAGNOSIS — E038 Other specified hypothyroidism: Secondary | ICD-10-CM | POA: Insufficient documentation

## 2016-08-05 MED ORDER — LEVOTHYROXINE SODIUM 25 MCG PO TABS: 25.0000 ug | ORAL_TABLET | Freq: Every day | ORAL | 0 refills | Status: DC

## 2016-08-11 ENCOUNTER — Other Ambulatory Visit: Payer: Self-pay | Admitting: Obstetrics and Gynecology

## 2016-08-11 DIAGNOSIS — Z1231 Encounter for screening mammogram for malignant neoplasm of breast: Secondary | ICD-10-CM

## 2016-09-16 ENCOUNTER — Other Ambulatory Visit: Payer: Self-pay | Admitting: Surgical

## 2016-09-16 ENCOUNTER — Other Ambulatory Visit (INDEPENDENT_AMBULATORY_CARE_PROVIDER_SITE_OTHER): Payer: BLUE CROSS/BLUE SHIELD

## 2016-09-16 DIAGNOSIS — E039 Hypothyroidism, unspecified: Secondary | ICD-10-CM

## 2016-09-16 LAB — TSH: TSH: 3.03 u[IU]/mL (ref 0.35–4.50)

## 2016-09-16 LAB — T4, FREE: Free T4: 0.91 ng/dL (ref 0.60–1.60)

## 2016-09-16 NOTE — Addendum Note (Signed)
Addended by: Frutoso Chase A on: 09/16/2016 11:38 AM   Modules accepted: Orders

## 2016-09-27 ENCOUNTER — Ambulatory Visit: Payer: BLUE CROSS/BLUE SHIELD

## 2016-09-28 ENCOUNTER — Other Ambulatory Visit: Payer: Self-pay | Admitting: Family Medicine

## 2016-09-28 DIAGNOSIS — E039 Hypothyroidism, unspecified: Secondary | ICD-10-CM

## 2016-09-30 DIAGNOSIS — G4733 Obstructive sleep apnea (adult) (pediatric): Secondary | ICD-10-CM | POA: Diagnosis not present

## 2016-10-05 ENCOUNTER — Encounter: Payer: Self-pay | Admitting: Family Medicine

## 2016-10-05 DIAGNOSIS — G4733 Obstructive sleep apnea (adult) (pediatric): Secondary | ICD-10-CM | POA: Diagnosis not present

## 2016-10-06 ENCOUNTER — Other Ambulatory Visit: Payer: Self-pay | Admitting: *Deleted

## 2016-10-06 DIAGNOSIS — R5383 Other fatigue: Secondary | ICD-10-CM

## 2016-10-12 ENCOUNTER — Encounter: Payer: Self-pay | Admitting: Family Medicine

## 2016-10-12 ENCOUNTER — Ambulatory Visit
Admission: RE | Admit: 2016-10-12 | Discharge: 2016-10-12 | Disposition: A | Discharge status: Home / Self Care | Payer: BLUE CROSS/BLUE SHIELD | Source: Ambulatory Visit | Attending: Obstetrics and Gynecology | Admitting: Obstetrics and Gynecology

## 2016-10-12 DIAGNOSIS — Z1231 Encounter for screening mammogram for malignant neoplasm of breast: Secondary | ICD-10-CM

## 2016-10-13 ENCOUNTER — Telehealth: Payer: Self-pay | Admitting: Internal Medicine

## 2016-10-13 NOTE — Telephone Encounter (Addendum)
Called and spoke with pt. Pt is requesting home sleep test requests to be sent to Dr. Juleen China. Home sleep test has been faxed to Dr. Juleen China via epic. Lm to make pt  aware.

## 2016-10-14 ENCOUNTER — Encounter: Payer: Self-pay | Admitting: Family Medicine

## 2016-10-14 NOTE — Telephone Encounter (Signed)
Pt aware. Nothing further needed 

## 2016-11-09 ENCOUNTER — Ambulatory Visit (INDEPENDENT_AMBULATORY_CARE_PROVIDER_SITE_OTHER): Payer: BLUE CROSS/BLUE SHIELD | Admitting: *Deleted

## 2016-11-09 DIAGNOSIS — Z23 Encounter for immunization: Secondary | ICD-10-CM

## 2016-11-21 ENCOUNTER — Other Ambulatory Visit: Payer: Self-pay | Admitting: Family Medicine

## 2016-12-28 ENCOUNTER — Other Ambulatory Visit: Payer: Self-pay | Admitting: Obstetrics and Gynecology

## 2017-02-15 HISTORY — PX: BREAST EXCISIONAL BIOPSY: SUR124

## 2017-03-29 ENCOUNTER — Other Ambulatory Visit: Payer: Self-pay | Admitting: Obstetrics and Gynecology

## 2017-03-29 DIAGNOSIS — N6452 Nipple discharge: Secondary | ICD-10-CM

## 2017-04-03 ENCOUNTER — Other Ambulatory Visit: Payer: Self-pay | Admitting: Family Medicine

## 2017-04-03 DIAGNOSIS — E039 Hypothyroidism, unspecified: Secondary | ICD-10-CM

## 2017-04-04 ENCOUNTER — Other Ambulatory Visit: Payer: Self-pay | Admitting: Obstetrics and Gynecology

## 2017-04-04 ENCOUNTER — Ambulatory Visit
Admission: RE | Admit: 2017-04-04 | Discharge: 2017-04-04 | Disposition: A | Discharge status: Home / Self Care | Payer: BLUE CROSS/BLUE SHIELD | Source: Ambulatory Visit | Attending: Obstetrics and Gynecology | Admitting: Obstetrics and Gynecology

## 2017-04-04 DIAGNOSIS — N6452 Nipple discharge: Secondary | ICD-10-CM

## 2017-04-04 DIAGNOSIS — N6320 Unspecified lump in the left breast, unspecified quadrant: Secondary | ICD-10-CM

## 2017-04-07 ENCOUNTER — Other Ambulatory Visit: Payer: Self-pay | Admitting: Obstetrics and Gynecology

## 2017-04-07 ENCOUNTER — Ambulatory Visit
Admission: RE | Admit: 2017-04-07 | Discharge: 2017-04-07 | Disposition: A | Discharge status: Home / Self Care | Payer: BLUE CROSS/BLUE SHIELD | Source: Ambulatory Visit | Attending: Obstetrics and Gynecology | Admitting: Obstetrics and Gynecology

## 2017-04-07 DIAGNOSIS — N6320 Unspecified lump in the left breast, unspecified quadrant: Secondary | ICD-10-CM

## 2017-04-25 ENCOUNTER — Other Ambulatory Visit: Payer: Self-pay | Admitting: General Surgery

## 2017-04-25 DIAGNOSIS — N6320 Unspecified lump in the left breast, unspecified quadrant: Secondary | ICD-10-CM

## 2017-04-29 NOTE — Pre-Procedure Instructions (Signed)
Terri Jensen  04/29/2017      [id number] - [place], [place] - [address] [name] [address] Phone: [phone] Fax: [phone]    Your procedure is scheduled on March 20  Report to Terri Jensen at 800 A.M.  Call this number if you have problems the morning of surgery:  [phone]   Remember:  Do not eat food or drink liquids after midnight.  Take these medicines the morning of surgery with A SIP OF WATER Fluoxetine (prozac), Levothyroxine (Synthroid)  Stop taking Aspirin, BC's, Goody's, Herbal medications, Fish Oil, Aleve, Ibuprofen, Advil, Motrin, Vitamins    How to Manage Your Diabetes Before and After Surgery  Why is it important to control my blood sugar before and after surgery? . Improving blood sugar levels before and after surgery helps healing and can limit problems. . A way of improving blood sugar control is eating a healthy diet by: o  Eating less sugar and carbohydrates o  Increasing activity/exercise o  Talking with your doctor about reaching your blood sugar goals . High blood sugars (greater than 180 mg/dL) can raise your risk of infections and slow your recovery, so you will need to focus on controlling your diabetes during the weeks before surgery. . Make sure that the doctor who takes care of your diabetes knows about your planned surgery including the date and location.  How do I manage my blood sugar before surgery? . Check your blood sugar at least 4 times a day, starting 2 days before surgery, to make sure that the level is not too high or low. o Check your blood sugar the morning of your surgery when you wake up and every 2 hours until you get to the Short Stay unit. . If your blood sugar is less than 70 mg/dL, you will need to treat for low blood sugar: o Do not take insulin. o Treat a low blood sugar (less than 70 mg/dL) with  cup of clear juice  (cranberry or apple), 4 glucose tablets, OR glucose gel. Recheck blood sugar in 15 minutes after treatment (to make sure it is greater than 70 mg/dL). If your blood sugar is not greater than 70 mg/dL on recheck, call [phone] o  for further instructions. . Report your blood sugar to the short stay nurse when you get to Short Stay.  . If you are admitted to the hospital after surgery: o Your blood sugar will be checked by the staff and you will probably be given insulin after surgery (instead of oral diabetes medicines) to make sure you have good blood sugar levels. o The goal for blood sugar control after surgery is 80-180 mg/dL.              WHAT DO I DO ABOUT MY DIABETES MEDICATION?   Do not take oral diabetes medicines (pills) the morning of surgery. Metformin (Glucophage)  . The day of surgery, do not take other diabetes injectables, including Byetta (exenatide), Bydureon (exenatide ER), Victoza (liraglutide), or Trulicity (dulaglutide).  . If your CBG is greater than 220 mg/dL, you may take  of your sliding scale (correction) dose of insulin.  Other Instructions:          Patient Signature:  Date:   Nurse Signature:  Date:   Reviewed and Endorsed by [facility] Patient Education Committee, August 2015  Do not wear jewelry, make-up or nail  polish.  Do not wear lotions, powders, or perfumes, or deodorant.  Do not shave 48 hours prior to surgery.  Men may shave face and neck.  Do not bring valuables to the hospital.  [facility] is not responsible for any belongings or valuables.  Contacts, dentures or bridgework may not be worn into surgery.  Leave your suitcase in the car.  After surgery it may be brought to your room.  For patients admitted to the hospital, discharge time will be determined by your treatment team.  Patients discharged the day of surgery will not be allowed to drive home.  Special instructions:  [facility]- Preparing For  Surgery  Before surgery, you can play an important role. Because skin is not sterile, your skin needs to be as free of germs as possible. You can reduce the number of germs on your skin by washing with CHG (chlorahexidine gluconate) Soap before surgery.  CHG is an antiseptic cleaner which kills germs and bonds with the skin to continue killing germs even after washing.  Please do not use if you have an allergy to CHG or antibacterial soaps. If your skin becomes reddened/irritated stop using the CHG.  Do not shave (including legs and underarms) for at least 48 hours prior to first CHG shower. It is OK to shave your face.  Please follow these instructions carefully.   1. Shower the NIGHT BEFORE SURGERY and the MORNING OF SURGERY with CHG.   2. If you chose to wash your hair, wash your hair first as usual with your normal shampoo.  3. After you shampoo, rinse your hair and body thoroughly to remove the shampoo.  4. Use CHG as you would any other liquid soap. You can apply CHG directly to the skin and wash gently with a scrungie or a clean washcloth.   5. Apply the CHG Soap to your body ONLY FROM THE NECK DOWN.  Do not use on open wounds or open sores. Avoid contact with your eyes, ears, mouth and genitals (private parts). Wash Face and genitals (private parts)  with your normal soap.  6. Wash thoroughly, paying special attention to the area where your surgery will be performed.  7. Thoroughly rinse your body with warm water from the neck down.  8. DO NOT shower/wash with your normal soap after using and rinsing off the CHG Soap.  9. Pat yourself dry with a CLEAN TOWEL.  10. Wear CLEAN PAJAMAS to bed the night before surgery, wear comfortable clothes the morning of surgery  11. Place CLEAN SHEETS on your bed the night of your first shower and DO NOT SLEEP WITH PETS.    Day of Surgery: Do not apply any deodorants/lotions. Please wear clean clothes to the hospital/surgery center.        Please read over the following fact sheets that you were given. Pain Booklet, Coughing and Deep Breathing and Surgical Site Infection Prevention

## 2017-05-02 ENCOUNTER — Other Ambulatory Visit: Payer: Self-pay

## 2017-05-02 ENCOUNTER — Encounter (HOSPITAL_COMMUNITY): Payer: Self-pay

## 2017-05-02 ENCOUNTER — Encounter (HOSPITAL_COMMUNITY)
Admission: RE | Admit: 2017-05-02 | Discharge: 2017-05-02 | Disposition: A | Discharge status: Home / Self Care | Payer: BLUE CROSS/BLUE SHIELD | Source: Ambulatory Visit | Attending: General Surgery | Admitting: General Surgery

## 2017-05-02 DIAGNOSIS — N6012 Diffuse cystic mastopathy of left breast: Secondary | ICD-10-CM | POA: Diagnosis not present

## 2017-05-02 DIAGNOSIS — Z87891 Personal history of nicotine dependence: Secondary | ICD-10-CM | POA: Diagnosis not present

## 2017-05-02 DIAGNOSIS — E7800 Pure hypercholesterolemia, unspecified: Secondary | ICD-10-CM | POA: Diagnosis not present

## 2017-05-02 DIAGNOSIS — Z91013 Allergy to seafood: Secondary | ICD-10-CM | POA: Diagnosis not present

## 2017-05-02 DIAGNOSIS — Z882 Allergy status to sulfonamides status: Secondary | ICD-10-CM | POA: Diagnosis not present

## 2017-05-02 DIAGNOSIS — N6452 Nipple discharge: Secondary | ICD-10-CM | POA: Diagnosis present

## 2017-05-02 DIAGNOSIS — Z79899 Other long term (current) drug therapy: Secondary | ICD-10-CM | POA: Diagnosis not present

## 2017-05-02 DIAGNOSIS — D242 Benign neoplasm of left breast: Secondary | ICD-10-CM | POA: Diagnosis not present

## 2017-05-02 DIAGNOSIS — Z9882 Breast implant status: Secondary | ICD-10-CM | POA: Diagnosis not present

## 2017-05-02 DIAGNOSIS — N6092 Unspecified benign mammary dysplasia of left breast: Secondary | ICD-10-CM | POA: Diagnosis not present

## 2017-05-02 DIAGNOSIS — E039 Hypothyroidism, unspecified: Secondary | ICD-10-CM | POA: Diagnosis not present

## 2017-05-02 DIAGNOSIS — Z885 Allergy status to narcotic agent status: Secondary | ICD-10-CM | POA: Diagnosis not present

## 2017-05-02 DIAGNOSIS — F419 Anxiety disorder, unspecified: Secondary | ICD-10-CM | POA: Diagnosis not present

## 2017-05-02 DIAGNOSIS — Z8249 Family history of ischemic heart disease and other diseases of the circulatory system: Secondary | ICD-10-CM | POA: Diagnosis not present

## 2017-05-02 DIAGNOSIS — Z7984 Long term (current) use of oral hypoglycemic drugs: Secondary | ICD-10-CM | POA: Diagnosis not present

## 2017-05-02 HISTORY — DX: Nausea with vomiting, unspecified: R11.2

## 2017-05-02 HISTORY — DX: Hypothyroidism, unspecified: E03.9

## 2017-05-02 HISTORY — DX: Other specified postprocedural states: Z98.890

## 2017-05-02 LAB — CBC
HCT: 41.3 % (ref 36.0–46.0)
Hemoglobin: 13.9 g/dL (ref 12.0–15.0)
MCH: 31.6 pg (ref 26.0–34.0)
MCHC: 33.7 g/dL (ref 30.0–36.0)
MCV: 93.9 fL (ref 78.0–100.0)
PLATELETS: 228 10*3/uL (ref 150–400)
RBC: 4.4 MIL/uL (ref 3.87–5.11)
RDW: 12.2 % (ref 11.5–15.5)
WBC: 6.5 10*3/uL (ref 4.0–10.5)

## 2017-05-02 LAB — BASIC METABOLIC PANEL
Anion gap: 10 (ref 5–15)
BUN: 12 mg/dL (ref 6–20)
CALCIUM: 9.6 mg/dL (ref 8.9–10.3)
CO2: 26 mmol/L (ref 22–32)
CREATININE: 0.94 mg/dL (ref 0.44–1.00)
Chloride: 103 mmol/L (ref 101–111)
GFR calc non Af Amer: >60 mL/min (ref >60)
Glucose, Bld: 99 mg/dL (ref 65–99)
Potassium: 3.6 mmol/L (ref 3.5–5.1)
SODIUM: 139 mmol/L (ref 135–145)

## 2017-05-02 LAB — GLUCOSE, CAPILLARY: Glucose-Capillary: 84 mg/dL (ref 65–99)

## 2017-05-02 LAB — HEMOGLOBIN A1C
HEMOGLOBIN A1C: 5.6 % (ref 4.8–5.6)
Mean Plasma Glucose: 114.02 mg/dL

## 2017-05-02 NOTE — Progress Notes (Signed)
PCp is Dr. Briscoe Deutscher Denies ever seeing a cardiologist Denies chest pain, cough, or fever. States the seed will be placed 05-03-17 at 1315 Reports she doesn't check her CBG's. Denies ever having an EKG, echo, stress test, or card cath.

## 2017-05-03 ENCOUNTER — Ambulatory Visit
Admission: RE | Admit: 2017-05-03 | Discharge: 2017-05-03 | Disposition: A | Discharge status: Home / Self Care | Payer: BLUE CROSS/BLUE SHIELD | Source: Ambulatory Visit | Attending: General Surgery | Admitting: General Surgery

## 2017-05-03 DIAGNOSIS — N6320 Unspecified lump in the left breast, unspecified quadrant: Secondary | ICD-10-CM

## 2017-05-04 ENCOUNTER — Encounter (HOSPITAL_COMMUNITY): Payer: Self-pay

## 2017-05-04 ENCOUNTER — Ambulatory Visit (HOSPITAL_COMMUNITY): Payer: BLUE CROSS/BLUE SHIELD | Admitting: Certified Registered Nurse Anesthetist

## 2017-05-04 ENCOUNTER — Ambulatory Visit
Admission: RE | Admit: 2017-05-04 | Discharge: 2017-05-04 | Disposition: A | Discharge status: Home / Self Care | Payer: BLUE CROSS/BLUE SHIELD | Source: Ambulatory Visit | Attending: General Surgery | Admitting: General Surgery

## 2017-05-04 ENCOUNTER — Encounter (HOSPITAL_COMMUNITY)
Admission: RE | Disposition: A | Discharge status: Home / Self Care | Payer: Self-pay | Source: Ambulatory Visit | Attending: General Surgery

## 2017-05-04 ENCOUNTER — Ambulatory Visit (HOSPITAL_COMMUNITY)
Admission: RE | Admit: 2017-05-04 | Discharge: 2017-05-04 | Disposition: A | Discharge status: Home / Self Care | Payer: BLUE CROSS/BLUE SHIELD | Source: Ambulatory Visit | Attending: General Surgery | Admitting: General Surgery

## 2017-05-04 DIAGNOSIS — D242 Benign neoplasm of left breast: Secondary | ICD-10-CM | POA: Insufficient documentation

## 2017-05-04 DIAGNOSIS — E7800 Pure hypercholesterolemia, unspecified: Secondary | ICD-10-CM | POA: Insufficient documentation

## 2017-05-04 DIAGNOSIS — Z9882 Breast implant status: Secondary | ICD-10-CM | POA: Insufficient documentation

## 2017-05-04 DIAGNOSIS — Z7984 Long term (current) use of oral hypoglycemic drugs: Secondary | ICD-10-CM | POA: Insufficient documentation

## 2017-05-04 DIAGNOSIS — N6320 Unspecified lump in the left breast, unspecified quadrant: Secondary | ICD-10-CM

## 2017-05-04 DIAGNOSIS — Z91013 Allergy to seafood: Secondary | ICD-10-CM | POA: Insufficient documentation

## 2017-05-04 DIAGNOSIS — F419 Anxiety disorder, unspecified: Secondary | ICD-10-CM | POA: Insufficient documentation

## 2017-05-04 DIAGNOSIS — N6452 Nipple discharge: Secondary | ICD-10-CM | POA: Insufficient documentation

## 2017-05-04 DIAGNOSIS — N6092 Unspecified benign mammary dysplasia of left breast: Secondary | ICD-10-CM | POA: Insufficient documentation

## 2017-05-04 DIAGNOSIS — Z79899 Other long term (current) drug therapy: Secondary | ICD-10-CM | POA: Insufficient documentation

## 2017-05-04 DIAGNOSIS — Z87891 Personal history of nicotine dependence: Secondary | ICD-10-CM | POA: Insufficient documentation

## 2017-05-04 DIAGNOSIS — E039 Hypothyroidism, unspecified: Secondary | ICD-10-CM | POA: Insufficient documentation

## 2017-05-04 DIAGNOSIS — Z885 Allergy status to narcotic agent status: Secondary | ICD-10-CM | POA: Insufficient documentation

## 2017-05-04 DIAGNOSIS — N6012 Diffuse cystic mastopathy of left breast: Secondary | ICD-10-CM | POA: Insufficient documentation

## 2017-05-04 DIAGNOSIS — Z882 Allergy status to sulfonamides status: Secondary | ICD-10-CM | POA: Insufficient documentation

## 2017-05-04 DIAGNOSIS — Z8249 Family history of ischemic heart disease and other diseases of the circulatory system: Secondary | ICD-10-CM | POA: Insufficient documentation

## 2017-05-04 HISTORY — PX: RADIOACTIVE SEED GUIDED EXCISIONAL BREAST BIOPSY: SHX6490

## 2017-05-04 LAB — GLUCOSE, CAPILLARY
GLUCOSE-CAPILLARY: 194 mg/dL — AB (ref 65–99)
Glucose-Capillary: 100 mg/dL — ABNORMAL HIGH (ref 65–99)

## 2017-05-04 SURGERY — RADIOACTIVE SEED GUIDED BREAST BIOPSY
Anesthesia: General | Site: Breast | Laterality: Left

## 2017-05-04 MED ORDER — ACETAMINOPHEN 500 MG PO TABS
1000.0000 mg | ORAL_TABLET | ORAL | Status: AC
  Administered 2017-05-04: 1000 mg via ORAL
  Filled 2017-05-04: qty 2

## 2017-05-04 MED ORDER — TRAMADOL HCL 50 MG PO TABS: 50.0000 mg | ORAL_TABLET | Freq: Four times a day (QID) | ORAL | 0 refills | Status: DC | PRN

## 2017-05-04 MED ORDER — ROCURONIUM BROMIDE 10 MG/ML (PF) SYRINGE
PREFILLED_SYRINGE | INTRAVENOUS | Status: AC
Start: 2017-05-04 — End: ?
  Filled 2017-05-04: qty 5

## 2017-05-04 MED ORDER — CELECOXIB 200 MG PO CAPS
400.0000 mg | ORAL_CAPSULE | ORAL | Status: AC
  Administered 2017-05-04: 400 mg via ORAL
  Filled 2017-05-04: qty 2

## 2017-05-04 MED ORDER — HYDROMORPHONE HCL 1 MG/ML IJ SOLN
0.2500 mg | INTRAMUSCULAR | Status: DC | PRN
Start: 2017-05-04 — End: 2017-05-04

## 2017-05-04 MED ORDER — BUPIVACAINE-EPINEPHRINE (PF) 0.25% -1:200000 IJ SOLN
INTRAMUSCULAR | Status: AC
  Filled 2017-05-04: qty 30

## 2017-05-04 MED ORDER — PROPOFOL 10 MG/ML IV BOLUS
INTRAVENOUS | Status: AC
  Filled 2017-05-04: qty 20

## 2017-05-04 MED ORDER — MIDAZOLAM HCL 2 MG/2ML IJ SOLN
INTRAMUSCULAR | Status: AC
  Filled 2017-05-04: qty 2

## 2017-05-04 MED ORDER — CEFAZOLIN SODIUM-DEXTROSE 2-4 GM/100ML-% IV SOLN
2.0000 g | INTRAVENOUS | Status: AC
  Administered 2017-05-04: 2 g via INTRAVENOUS
  Filled 2017-05-04: qty 100

## 2017-05-04 MED ORDER — FENTANYL CITRATE (PF) 250 MCG/5ML IJ SOLN
INTRAMUSCULAR | Status: DC | PRN
  Administered 2017-05-04: 25 ug via INTRAVENOUS

## 2017-05-04 MED ORDER — BUPIVACAINE-EPINEPHRINE 0.25% -1:200000 IJ SOLN
INTRAMUSCULAR | Status: DC | PRN
  Administered 2017-05-04: 10 mL

## 2017-05-04 MED ORDER — ONDANSETRON HCL 4 MG/2ML IJ SOLN
INTRAMUSCULAR | Status: AC
Start: 2017-05-04 — End: ?
  Filled 2017-05-04: qty 2

## 2017-05-04 MED ORDER — 0.9 % SODIUM CHLORIDE (POUR BTL) OPTIME
TOPICAL | Status: DC | PRN
  Administered 2017-05-04: 1000 mL

## 2017-05-04 MED ORDER — DEXAMETHASONE SODIUM PHOSPHATE 10 MG/ML IJ SOLN
INTRAMUSCULAR | Status: AC
Start: 2017-05-04 — End: ?
  Filled 2017-05-04: qty 1

## 2017-05-04 MED ORDER — LIDOCAINE 2% (20 MG/ML) 5 ML SYRINGE
INTRAMUSCULAR | Status: DC | PRN
  Administered 2017-05-04: 60 mg via INTRAVENOUS

## 2017-05-04 MED ORDER — LACTATED RINGERS IV SOLN
INTRAVENOUS | Status: DC | PRN
  Administered 2017-05-04: 09:00:00 via INTRAVENOUS

## 2017-05-04 MED ORDER — GABAPENTIN 300 MG PO CAPS
300.0000 mg | ORAL_CAPSULE | ORAL | Status: AC
  Administered 2017-05-04: 300 mg via ORAL
  Filled 2017-05-04: qty 1

## 2017-05-04 MED ORDER — PHENYLEPHRINE 40 MCG/ML (10ML) SYRINGE FOR IV PUSH (FOR BLOOD PRESSURE SUPPORT)
PREFILLED_SYRINGE | INTRAVENOUS | Status: DC | PRN
  Administered 2017-05-04 (×2): 80 ug via INTRAVENOUS

## 2017-05-04 MED ORDER — ONDANSETRON HCL 4 MG/2ML IJ SOLN
INTRAMUSCULAR | Status: DC | PRN
  Administered 2017-05-04: 4 mg via INTRAVENOUS

## 2017-05-04 MED ORDER — PHENYLEPHRINE 40 MCG/ML (10ML) SYRINGE FOR IV PUSH (FOR BLOOD PRESSURE SUPPORT)
PREFILLED_SYRINGE | INTRAVENOUS | Status: AC
  Filled 2017-05-04: qty 10

## 2017-05-04 MED ORDER — FENTANYL CITRATE (PF) 250 MCG/5ML IJ SOLN
INTRAMUSCULAR | Status: AC
  Filled 2017-05-04: qty 5

## 2017-05-04 MED ORDER — DEXAMETHASONE SODIUM PHOSPHATE 10 MG/ML IJ SOLN
INTRAMUSCULAR | Status: DC | PRN
  Administered 2017-05-04: 10 mg via INTRAVENOUS

## 2017-05-04 MED ORDER — PROPOFOL 10 MG/ML IV BOLUS
INTRAVENOUS | Status: DC | PRN
  Administered 2017-05-04: 200 mg via INTRAVENOUS

## 2017-05-04 MED ORDER — MIDAZOLAM HCL 2 MG/2ML IJ SOLN
INTRAMUSCULAR | Status: DC | PRN
  Administered 2017-05-04: 2 mg via INTRAVENOUS

## 2017-05-04 SURGICAL SUPPLY — 50 items
APPLIER CLIP 9.375 MED OPEN (MISCELLANEOUS)
BINDER BREAST LRG (GAUZE/BANDAGES/DRESSINGS) IMPLANT
BINDER BREAST XLRG (GAUZE/BANDAGES/DRESSINGS) IMPLANT
BLADE SURG 10 STRL SS (BLADE) ×2 IMPLANT
BLADE SURG 15 STRL LF DISP TIS (BLADE) ×1 IMPLANT
BLADE SURG 15 STRL SS (BLADE) ×1
CANISTER SUCT 3000ML PPV (MISCELLANEOUS) IMPLANT
CHLORAPREP W/TINT 26ML (MISCELLANEOUS) ×2 IMPLANT
CLIP APPLIE 9.375 MED OPEN (MISCELLANEOUS) IMPLANT
CLOSURE STERI-STRIP 1/4X4 (GAUZE/BANDAGES/DRESSINGS) ×2 IMPLANT
COVER PROBE W GEL 5X96 (DRAPES) ×2 IMPLANT
COVER SURGICAL LIGHT HANDLE (MISCELLANEOUS) ×2 IMPLANT
DERMABOND ADVANCED (GAUZE/BANDAGES/DRESSINGS) ×1
DERMABOND ADVANCED .7 DNX12 (GAUZE/BANDAGES/DRESSINGS) ×1 IMPLANT
DEVICE DUBIN SPECIMEN MAMMOGRA (MISCELLANEOUS) ×2 IMPLANT
DRAPE CHEST BREAST 15X10 FENES (DRAPES) ×2 IMPLANT
DRAPE UTILITY XL STRL (DRAPES) ×2 IMPLANT
ELECT COATED BLADE 2.86 ST (ELECTRODE) ×2 IMPLANT
ELECT REM PT RETURN 9FT ADLT (ELECTROSURGICAL) ×2
ELECTRODE REM PT RTRN 9FT ADLT (ELECTROSURGICAL) ×1 IMPLANT
GLOVE BIO SURGEON STRL SZ7 (GLOVE) ×2 IMPLANT
GLOVE BIOGEL PI IND STRL 7.5 (GLOVE) ×1 IMPLANT
GLOVE BIOGEL PI INDICATOR 7.5 (GLOVE) ×1
GOWN STRL REUS W/ TWL LRG LVL3 (GOWN DISPOSABLE) ×2 IMPLANT
GOWN STRL REUS W/TWL LRG LVL3 (GOWN DISPOSABLE) ×2
HEMOSTAT ARISTA ABSORB 3G PWDR (MISCELLANEOUS) IMPLANT
ILLUMINATOR WAVEGUIDE N/F (MISCELLANEOUS) IMPLANT
KIT BASIN OR (CUSTOM PROCEDURE TRAY) ×2 IMPLANT
KIT MARKER MARGIN INK (KITS) ×2 IMPLANT
LIGHT WAVEGUIDE WIDE FLAT (MISCELLANEOUS) IMPLANT
MARKER SKIN DUAL TIP RULER LAB (MISCELLANEOUS) ×2 IMPLANT
NEEDLE HYPO 25GX1X1/2 BEV (NEEDLE) ×2 IMPLANT
NS IRRIG 1000ML POUR BTL (IV SOLUTION) IMPLANT
PACK SURGICAL SETUP 50X90 (CUSTOM PROCEDURE TRAY) ×2 IMPLANT
PENCIL BUTTON HOLSTER BLD 10FT (ELECTRODE) ×2 IMPLANT
SPONGE LAP 18X18 X RAY DECT (DISPOSABLE) ×2 IMPLANT
STRIP CLOSURE SKIN 1/2X4 (GAUZE/BANDAGES/DRESSINGS) IMPLANT
SUT MNCRL AB 4-0 PS2 18 (SUTURE) ×2 IMPLANT
SUT MON AB 5-0 PS2 18 (SUTURE) ×2 IMPLANT
SUT SILK 2 0 SH (SUTURE) IMPLANT
SUT VIC AB 2-0 SH 27 (SUTURE) ×1
SUT VIC AB 2-0 SH 27XBRD (SUTURE) ×1 IMPLANT
SUT VIC AB 3-0 SH 27 (SUTURE) ×1
SUT VIC AB 3-0 SH 27X BRD (SUTURE) ×1 IMPLANT
SYR BULB 3OZ (MISCELLANEOUS) ×2 IMPLANT
SYR CONTROL 10ML LL (SYRINGE) ×2 IMPLANT
TOWEL OR 17X24 6PK STRL BLUE (TOWEL DISPOSABLE) ×2 IMPLANT
TOWEL OR 17X26 10 PK STRL BLUE (TOWEL DISPOSABLE) ×2 IMPLANT
TUBE CONNECTING 12X1/4 (SUCTIONS) IMPLANT
YANKAUER SUCT BULB TIP NO VENT (SUCTIONS) IMPLANT

## 2017-05-04 NOTE — Anesthesia Preprocedure Evaluation (Addendum)
Anesthesia Evaluation  Patient identified by MRN, date of birth, ID band Patient awake    Reviewed: Allergy & Precautions, H&P , NPO status , Patient's Chart, lab work & pertinent test results  History of Anesthesia Complications (+) PONV  Airway Mallampati: II  TM Distance: >3 FB Neck ROM: Full    Dental no notable dental hx. (+) Teeth Intact, Dental Advisory Given   Pulmonary neg pulmonary ROS, former smoker,    Pulmonary exam normal breath sounds clear to auscultation       Cardiovascular negative cardio ROS   Rhythm:Regular Rate:Normal     Neuro/Psych Anxiety negative neurological ROS     GI/Hepatic negative GI ROS, Neg liver ROS,   Endo/Other  Hypothyroidism   Renal/GU negative Renal ROS  negative genitourinary   Musculoskeletal   Abdominal   Peds  Hematology negative hematology ROS (+)   Anesthesia Other Findings   Reproductive/Obstetrics negative OB ROS                             Anesthesia Physical Anesthesia Plan  ASA: II  Anesthesia Plan: General   Post-op Pain Management:    Induction: Intravenous  PONV Risk Score and Plan: 4 or greater and Ondansetron, Dexamethasone and Midazolam  Airway Management Planned: LMA  Additional Equipment:   Intra-op Plan:   Post-operative Plan: Extubation in OR  Informed Consent: I have reviewed the patients History and Physical, chart, labs and discussed the procedure including the risks, benefits and alternatives for the proposed anesthesia with the patient or authorized representative who has indicated his/her understanding and acceptance.   Dental advisory given  Plan Discussed with: CRNA  Anesthesia Plan Comments:         Anesthesia Quick Evaluation

## 2017-05-04 NOTE — Op Note (Signed)
Preoperative diagnoses:left breast mass on [place] with nipple discharge, core biopsy with papillary lesion Postoperative diagnosis: Same as above Procedure:Left breastseed guided excisional biopsy Surgeon: Dr. Serita Grammes Anesthesia: Gen. Estimated blood loss: minimal Complications: None Drains: None Specimens:leftbreast tissue marked with paint Sponge and needle count correct at completion Disposition to recovery stable  Indications: This is a81 yof who presents with clear nipple discharge, implants and a mass behind left areola that is possible papillary lesion on core.We discussed options and she elected for seed guided excisional biopsy.   Procedure: After informed consent was obtained she was then taken to the operating room. She was given antibiotics.Sequential compression devices were on her legs. She was placed under general anesthesia without complication. Her chestwas then prepped and draped in the standard sterile surgical fashion. A surgical timeout was then performed.   The seed was in thesubareolar left breast. I made a periareolar incision to hide the scar. this was already scarred down from prior surgery. I infiltrated marcaine throughout the area.I was not able to express discharge.  I then used the neoprobe to guide excision of the seed and the surrounding tissue.   This was then all sent to pathology. Hemostasis was observed. I closed the breast tissue with a 2-0 Vicryl. The dermis was closed with 3-0 Vicryl and the skin with 5-0 Monocryl.Dermabond and steristrips were placed on the incision. A breast binder was placed. She was transferred to recovery stable

## 2017-05-04 NOTE — Discharge Instructions (Signed)
[facility] °Office Phone Number [phone] °POST OP INSTRUCTIONS ° °Always review your discharge instruction sheet given to you by the facility where your surgery was performed. ° °IF YOU HAVE DISABILITY OR FAMILY LEAVE FORMS, YOU MUST BRING THEM TO THE OFFICE FOR PROCESSING.  DO NOT GIVE THEM TO YOUR DOCTOR. ° °1. A prescription for pain medication may be given to you upon discharge.  Take your pain medication as prescribed, if needed.  If narcotic pain medicine is not needed, then you may take acetaminophen (Tylenol), naprosyn (Alleve) or ibuprofen (Advil) as needed. °2. Take your usually prescribed medications unless otherwise directed °3. If you need a refill on your pain medication, please contact your pharmacy.  They will contact our office to request authorization.  Prescriptions will not be filled after 5pm or on week-ends. °4. You should eat very light the first 24 hours after surgery, such as soup, crackers, pudding, etc.  Resume your normal diet the day after surgery. °5. Most patients will experience some swelling and bruising in the breast.  Ice packs and a good support bra will help.  Wear the breast binder provided or a sports bra for 72 hours day and night.  After that wear a sports bra during the day until you return to the office. Swelling and bruising can take several days to resolve.  °6. It is common to experience some constipation if taking pain medication after surgery.  Increasing fluid intake and taking a stool softener will usually help or prevent this problem from occurring.  A mild laxative (Milk of Magnesia or Miralax) should be taken according to package directions if there are no bowel movements after 48 hours. °7. Unless discharge instructions indicate otherwise, you may remove your bandages 48 hours after surgery and you may shower at that time.  You may have steri-strips (small skin tapes) in place directly over the incision.  These strips should be left on the  skin for 7-10 days and will come off on their own.  If your surgeon used skin glue on the incision, you may shower in 24 hours.  The glue will flake off over the next 2-3 weeks.  Any sutures or staples will be removed at the office during your follow-up visit. °8. ACTIVITIES:  You may resume regular daily activities (gradually increasing) beginning the next day.  Wearing a good support bra or sports bra minimizes pain and swelling.  You may have sexual intercourse when it is comfortable. °a. You may drive when you no longer are taking prescription pain medication, you can comfortably wear a seatbelt, and you can safely maneuver your car and apply brakes. °b. RETURN TO WORK:  ______________________________________________________________________________________ °9. You should see your doctor in the office for a follow-up appointment approximately two weeks after your surgery.  Your doctor’s nurse will typically make your follow-up appointment when she calls you with your pathology report.  Expect your pathology report 3-4 business days after your surgery.  You may call to check if you do not hear from us after three days. °10. OTHER INSTRUCTIONS: _______________________________________________________________________________________________ _____________________________________________________________________________________________________________________________________ °_____________________________________________________________________________________________________________________________________ °_____________________________________________________________________________________________________________________________________ ° °WHEN TO CALL DR Zoltan Genest: °1. Fever over 101.0 °2. Nausea and/or vomiting. °3. Extreme swelling or bruising. °4. Continued bleeding from incision. °5. Increased pain, redness, or drainage from the incision. ° °The clinic staff is available to answer your questions during regular  business hours.  Please don’t hesitate to call and ask to speak to one of the nurses for clinical concerns.  If you   have a medical emergency, go to the nearest emergency room or call 911.  A surgeon from [facility] is always on call at the hospital. ° °For further questions, please visit [email or web address] ° °

## 2017-05-04 NOTE — Anesthesia Procedure Notes (Signed)
Procedure Name: LMA Insertion Date/Time: 05/04/2017 10:14 AM Performed by: Harden Mo, CRNA Pre-anesthesia Checklist: Patient identified, Emergency Drugs available, Suction available and Patient being monitored Patient Re-evaluated:Patient Re-evaluated prior to induction Oxygen Delivery Method: Circle System Utilized Preoxygenation: Pre-oxygenation with 100% oxygen Induction Type: IV induction Ventilation: Mask ventilation without difficulty LMA: LMA inserted LMA Size: 4.0 Number of attempts: 1 Airway Equipment and Method: Bite block Placement Confirmation: positive ETCO2 Tube secured with: Tape Dental Injury: Teeth and Oropharynx as per pre-operative assessment

## 2017-05-04 NOTE — Transfer of Care (Signed)
Immediate Anesthesia Transfer of Care Note  Patient: Terri Jensen  Procedure(s) Performed: RADIOACTIVE SEED GUIDED EXCISIONAL BREAST BIOPSY (Left Breast)  Patient Location: PACU  Anesthesia Type:General  Level of Consciousness: awake, alert  and oriented  Airway & Oxygen Therapy: Patient Spontanous Breathing  Post-op Assessment: Report given to RN, Post -op Vital signs reviewed and stable and Patient moving all extremities X 4  Post vital signs: Reviewed and stable  Last Vitals:  Vitals:   05/04/17 0818  BP: (!) 149/82  Pulse: 98  Resp: 18  Temp: 36.8 C  SpO2: 100%    Last Pain:  Vitals:   05/04/17 0818  TempSrc: Oral  PainSc: 2       Patients Stated Pain Goal: 2 (70/35/00 9381)  Complications: No apparent anesthesia complications

## 2017-05-04 NOTE — Anesthesia Postprocedure Evaluation (Signed)
Anesthesia Post Note  Patient: Terri Jensen  Procedure(s) Performed: RADIOACTIVE SEED GUIDED EXCISIONAL BREAST BIOPSY (Left Breast)     Patient location during evaluation: PACU Anesthesia Type: General Level of consciousness: awake and alert Pain management: pain level controlled Vital Signs Assessment: post-procedure vital signs reviewed and stable Respiratory status: spontaneous breathing, nonlabored ventilation and respiratory function stable Cardiovascular status: blood pressure returned to baseline and stable Postop Assessment: no apparent nausea or vomiting Anesthetic complications: no    Last Vitals:  Vitals:   05/04/17 1120 05/04/17 1134  BP: 128/89 133/80  Pulse: (!) 101 98  Resp: 18 18  Temp: 36.7 C   SpO2: 100% 100%    Last Pain:  Vitals:   05/04/17 1134  TempSrc:   PainSc: 0-No pain                 Lou Loewe,W. EDMOND

## 2017-05-04 NOTE — H&P (Signed)
52 yof referred by Dr Juleen China for left nipple dc and mass on [place]. she has prior history of implants over 20 years ago. had infection/hematoma on left side that required implant out and replacing. it has not changed since then. she has thought about removal/replacement but does not want to do that now. she noted a month ago left sided spontaneous clear nipple dc that has persisted. not bloody. she has fh of breast cancer in her mom early 5s. she has c density breasts. mm negative. she then had US showing multiple dilated retroareolar ducts with one at 12 oclock having a 4x3x3 mm solid oval mass and a suggestion of a 2 mm mass near that. she had biopsy of 4 mm lesion that is minute fragment of papillary lesion   Past Surgical History Levonne Spiller, CMA; 04/25/2017 9:52 AM) Breast Augmentation  Bilateral. Breast Biopsy  Left. Cesarean Section - 1  Gallbladder Surgery - Laparoscopic  Oral Surgery   Diagnostic Studies History Levonne Spiller, CMA; 04/25/2017 9:52 AM) Colonoscopy  within last year Mammogram  within last year Pap Smear  1-5 years ago  Allergies Levonne Spiller, [place]; 04/25/2017 9:53 AM) No Known Allergies [04/25/2017]: Allergies Reconciled   Medication History Levonne Spiller, CMA; 04/25/2017 9:55 AM) Levothyroxine Sodium (25MCG Tablet, Oral) Active. Rosuvastatin Calcium (5MG  Tablet, Oral) Active. Vitamin D (Cholecalciferol) (Oral) Specific strength unknown - Active. MetFORMIN HCl (500MG  Tablet, Oral) Active. Medications Reconciled  Social History Andee Poles Education officer, [employer], CMA; 04/25/2017 9:53 AM) Alcohol use  Moderate alcohol use. Caffeine use  Carbonated beverages, Tea. No drug use  Tobacco use  Former smoker.  Family History Levonne Spiller, [place]; 04/25/2017 9:52 AM) Arthritis  Father. Breast Cancer  Mother. Cerebrovascular Accident  Mother. Colon Polyps  Father. Diabetes Mellitus  Father, Mother. Heart Disease  Father,  Mother. Heart disease in female family member before age 3  Hypertension  Father, Mother. Kidney Disease  Father. Respiratory Condition  Mother. Thyroid problems  Mother.  Pregnancy / Birth History Levonne Spiller, CMA; 04/25/2017 9:53 AM) Age at menarche  12 years. Age of menopause  [age] Contraceptive History  Oral contraceptives. Gravida  2 Irregular periods  Length (months) of breastfeeding  3-6 Maternal age  55-30 Para  2  Other Problems Levonne Spiller, [place]; 04/25/2017 9:53 AM) Hypercholesterolemia  Kidney Stone  Thyroid Disease     Review of Systems Andee Poles Gerrigner CMA; 04/25/2017 9:53 AM) General Not Present- Appetite Loss, Chills, Fatigue, Fever, Night Sweats, Weight Gain and Weight Loss. Skin Not Present- Change in Wart/Mole, Dryness, Hives, Jaundice, New Lesions, Non-Healing Wounds, Rash and Ulcer. HEENT Present- Wears glasses/contact lenses. Not Present- Earache, Hearing Loss, Hoarseness, Nose Bleed, Oral Ulcers, Ringing in the Ears, Seasonal Allergies, Sinus Pain, Sore Throat, Visual Disturbances and Yellow Eyes. Respiratory Present- Snoring. Not Present- Bloody sputum, Chronic Cough, Difficulty Breathing and Wheezing. Breast Present- Breast Mass and Nipple Discharge. Not Present- Breast Pain and Skin Changes. Cardiovascular Not Present- Chest Pain, Difficulty Breathing Lying Down, Leg Cramps, Palpitations, Rapid Heart Rate, Shortness of Breath and Swelling of Extremities. Gastrointestinal Not Present- Abdominal Pain, Bloating, Bloody Stool, Change in Bowel Habits, Chronic diarrhea, Constipation, Difficulty Swallowing, Excessive gas, Gets full quickly at meals, Hemorrhoids, Indigestion, Nausea, Rectal Pain and Vomiting. Female Genitourinary Not Present- Frequency, Nocturia, Painful Urination, Pelvic Pain and Urgency. Musculoskeletal Not Present- Back Pain, Joint Pain, Joint Stiffness, Muscle Pain, Muscle Weakness and Swelling of  Extremities. Neurological Not Present- Decreased Memory, Fainting, Headaches, Numbness, Seizures, Tingling, Tremor, Trouble walking and Weakness. Psychiatric Not Present- Anxiety,  Bipolar, Change in Sleep Pattern, Depression, Fearful and Frequent crying. Endocrine Not Present- Cold Intolerance, Excessive Hunger, Hair Changes, Heat Intolerance, Hot flashes and New Diabetes. Hematology Not Present- Blood Thinners, Easy Bruising, Excessive bleeding, Gland problems, HIV and Persistent Infections.  Vitals Andee Poles Gerrigner CMA; 04/25/2017 9:53 AM) 04/25/2017 9:53 AM Weight: 219.38 lb Height: 63in Body Surface Area: 2.01 m Body Mass Index: 38.86 kg/m  Temp.: 98.71F(Oral)  Pulse: 92 (Regular)  BP: 120/82 (Sitting, Right Arm, Large) Physical Exam Rolm Bookbinder MD; 04/25/2017 10:17 AM) General Mental Status-Alert. Head and Neck Trachea-midline. Thyroid Gland Characteristics - normal size and consistency. Eye Sclera/Conjunctiva - Bilateral-No scleral icterus. Chest and Lung Exam Chest and lung exam reveals -quiet, even and easy respiratory effort with no use of accessory muscles and on auscultation, normal breath sounds, no adventitious sounds and normal vocal resonance. Breast Nipples-No Discharge. Breast Lump-No Palpable Breast Mass. Note: I cannot tell if there is discharge today she has healed bilateral periareolar, the left side has indentation of entire nac from prior surgery and reoperation, bilateral implants Cardiovascular Cardiovascular examination reveals -normal heart sounds, regular rate and rhythm with no murmurs. Neurologic Neurologic evaluation reveals -alert and oriented x 3 with no impairment of recent or remote memory. Lymphatic Head & Neck General Head & Neck Lymphatics: Bilateral - Description - Normal. Axillary General Axillary Region: Bilateral - Description - Normal. Note: no [place] adenopathy   Assessment & Plan Rolm Bookbinder MD; 04/25/2017 10:18 AM) LEFT BREAST MASS (N63.20) Story: Left breast seed guided excision she has fh in mom about her age. also with spontaneous clear unilateral dc. path is papillary lesion. we discussed observation vs excision. she would like to have excised. she does not want to do anything with implants at this time either. I was concerned left side might have issue but she states it has been that way since placed. we discussed seed guided excision of the left breast mass and will remove that entire ductal system NIPPLE DISCHARGE IN FEMALE (N64.52)

## 2017-05-05 ENCOUNTER — Encounter (HOSPITAL_COMMUNITY): Payer: Self-pay | Admitting: General Surgery

## 2017-05-12 ENCOUNTER — Other Ambulatory Visit: Payer: Self-pay | Admitting: Family Medicine

## 2017-08-29 ENCOUNTER — Other Ambulatory Visit: Payer: Self-pay | Admitting: Family Medicine

## 2017-09-13 ENCOUNTER — Other Ambulatory Visit: Payer: Self-pay | Admitting: Obstetrics and Gynecology

## 2017-09-13 DIAGNOSIS — Z1231 Encounter for screening mammogram for malignant neoplasm of breast: Secondary | ICD-10-CM

## 2017-09-18 NOTE — Progress Notes (Signed)
Terri Jensen is a 52 y.o. female is here for follow up.  History of Present Illness:   Terri Jensen, CMA acting as scribe for Dr. Briscoe Jensen.   HPI: Patient in office for follow up on B-12 and vitamin D. She has been taking daily supplements on both. She She has been having pain with bending right foot that is worse in the morning for about two months. She has noticed a knot at right big toe. She has taking Advil that gives relief from the pain. Patient states pain is 2/10 and denies any injury to foot or toe.   Health Maintenance Due  Topic Date Due  . INFLUENZA VACCINE  09/15/2017   Depression screen [facility] 2/9 09/20/2017 04/05/2016 04/05/2016  Decreased Interest 0 0 0  Down, Depressed, Hopeless 0 0 0  PHQ - 2 Score 0 0 0  Altered sleeping 1 - -  Tired, decreased energy 1 - -  Change in appetite 3 - -  Trouble concentrating 0 - -  Moving slowly or fidgety/restless 0 - -  Suicidal thoughts 0 - -  PHQ-9 Score 5 - -   PMHx, SurgHx, SocialHx, FamHx, Medications, and Allergies were reviewed in the Visit Navigator and updated as appropriate.   Patient Active Problem List   Diagnosis Date Noted  . Hypothyroidism 08/05/2016  . Low vitamin D level 04/05/2016  . Prediabetes 04/05/2016  . Perimenopause 04/05/2016  . Obesity (BMI 35.0-39.9 without comorbidity) 04/05/2016  . Hyperlipidemia 04/05/2016  . Encounter for cosmetic surgery 05/05/2015  . Kidney stones    Social History   Tobacco Use  . Smoking status: Former Smoker    Packs/day: 1.00    Years: 7.00    Pack years: 7.00    Types: Cigarettes    Last attempt to quit: 04/23/1990    Years since quitting: 27.4  . Smokeless tobacco: Never Used  Substance Use Topics  . Alcohol use: Yes    Alcohol/week: 3.6 - 4.8 oz    Types: 2 Standard drinks or equivalent, 4 - 6 Glasses of wine per week  . Drug use: No   Current Medications and Allergies:   .  FLUoxetine (PROZAC) 20 MG capsule, Take 20 mg by mouth daily., Disp: , Rfl:   .  levothyroxine (SYNTHROID, LEVOTHROID) 25 MCG tablet, TAKE 1 TABLET (25 MCG TOTAL) BY MOUTH DAILY BEFORE BREAKFAST., Disp: 60 tablet, Rfl: 2 .  metFORMIN (GLUCOPHAGE) 500 MG tablet, Take 1 tablet (500 mg total) by mouth 2 (two) times daily with a meal. (Patient taking differently: Take 500 mg by mouth at bedtime. ), Disp: 180 tablet, Rfl: 3 .  Multiple Vitamins-Minerals (MULTIVITAMIN ADULT) CHEW, Chew 2 each by mouth daily., Disp: , Rfl:  .  rosuvastatin (CRESTOR) 5 MG tablet, TAKE 1 TABLET BY MOUTH EVERY DAY, Disp: 30 tablet, Rfl: 0 .  traMADol (ULTRAM) 50 MG tablet, Take 1 tablet (50 mg total) by mouth every 6 (six) hours as needed., Disp: 10 tablet, Rfl: 0 .  tretinoin (RETIN-A) 0.1 % cream, APPLY TO AFFECTED AREA EVERY DAY, Disp: , Rfl:  .  vitamin B-12 (CYANOCOBALAMIN) 1000 MCG tablet, Take 1,000 mcg by mouth daily., Disp: , Rfl:  .  YUVAFEM 10 MCG TABS vaginal tablet, , Disp: , Rfl:    Allergies  Allergen Reactions  . Shellfish Allergy Anaphylaxis  . Adhesive [Tape] Rash  . Codeine Nausea And Vomiting  . Sulfa Antibiotics Rash    As a young child   Review of  Systems   Pertinent items are noted in the HPI. Otherwise, ROS is negative.  Vitals:   Vitals:   09/19/17 1530  BP: 124/76  Pulse: 98  Temp: 98.7 F (37.1 C)  TempSrc: Oral  SpO2: 96%  Weight: 220 lb (99.8 kg)  Height: 5\' 3"  (1.6 m)     Body mass index is 38.97 kg/m.  Physical Exam:   Physical Exam  Constitutional: She is oriented to person, place, and time. She appears well-developed and well-nourished. No distress.  HENT:  Head: Normocephalic and atraumatic.  Right Ear: External ear normal.  Left Ear: External ear normal.  Nose: Nose normal.  Mouth/Throat: Oropharynx is clear and moist.  Eyes: Pupils are equal, round, and reactive to light. Conjunctivae and EOM are normal.  Neck: Normal range of motion. Neck supple. No thyromegaly present.  Cardiovascular: Normal rate, regular rhythm, normal heart  sounds and intact distal pulses.  Pulmonary/Chest: Effort normal and breath sounds normal.  Abdominal: Soft. Bowel sounds are normal.  Musculoskeletal: Normal range of motion.  Right great toe IP joint ttp, with FROM and no erythema or edema or heat.  Lymphadenopathy:    She has no cervical adenopathy.  Neurological: She is alert and oriented to person, place, and time.  Skin: Skin is warm and dry. Capillary refill takes less than 2 seconds.  Psychiatric: She has a normal mood and affect. Her behavior is normal.  Nursing note and vitals reviewed.    Assessment and Plan:   Terri Jensen was seen today for follow-up.  Diagnoses and all orders for this visit:  Hyperlipidemia, unspecified hyperlipidemia type -     CBC with Differential/Platelet -     Comprehensive metabolic panel -     Hemoglobin A1c -     rosuvastatin (CRESTOR) 5 MG tablet; Take 1 tablet (5 mg total) by mouth daily.  Hypothyroidism, unspecified type -     TSH -     T4, free -     Lipid panel -     levothyroxine (SYNTHROID, LEVOTHROID) 25 MCG tablet; Take 1 tablet (25 mcg total) by mouth daily before breakfast.  Vitamin B 12 deficiency -     Vitamin B12  Prediabetes Comments: A1c 5.9 in 2016. Has gained about 20 pounds since that time. Recheck, will add insulin level as well. Orders: -     metFORMIN (GLUCOPHAGE) 500 MG tablet; Take 1 tablet (500 mg total) by mouth at bedtime.  Fatigue, unspecified type  Low vitamin D level -     VITAMIN D 25 Hydroxy (Vit-D Deficiency, Fractures)  Perimenopause -     FLUoxetine (PROZAC) 20 MG capsule; Take 2 capsules (40 mg total) by mouth daily.  Arthralgia of right foot -     meloxicam (MOBIC) 15 MG tablet; Take 1 tablet (15 mg total) by mouth daily.   . Reviewed expectations re: course of current medical issues. . Discussed self-management of symptoms. . Outlined signs and symptoms indicating need for more acute intervention. . Patient verbalized understanding and all  questions were answered. Marland Kitchen Health Maintenance issues including appropriate healthy diet, exercise, and smoking avoidance were discussed with patient. . See orders for this visit as documented in the electronic medical record. . Patient received an After Visit Summary.  Terri Deutscher, DO [place] 09/21/2017  Future Appointments  Date Time Provider [place]  11/04/2017 10:20 AM [facility]  12/20/2017  4:00 PM Terri Deutscher, DO [facility]   CMA served  as scribe during this visit. History, Physical, and Plan performed by medical provider. The above documentation has been reviewed and is accurate and complete. Terri Jensen, D.O.

## 2017-09-19 ENCOUNTER — Encounter: Payer: Self-pay | Admitting: Family Medicine

## 2017-09-19 ENCOUNTER — Ambulatory Visit (INDEPENDENT_AMBULATORY_CARE_PROVIDER_SITE_OTHER): Payer: BLUE CROSS/BLUE SHIELD | Admitting: Family Medicine

## 2017-09-19 VITALS — BP 124/76 | HR 98 | Temp 98.7°F | Ht 63.0 in | Wt 220.0 lb

## 2017-09-19 DIAGNOSIS — R7303 Prediabetes: Secondary | ICD-10-CM

## 2017-09-19 DIAGNOSIS — E039 Hypothyroidism, unspecified: Secondary | ICD-10-CM | POA: Diagnosis not present

## 2017-09-19 DIAGNOSIS — E785 Hyperlipidemia, unspecified: Secondary | ICD-10-CM

## 2017-09-19 DIAGNOSIS — R7989 Other specified abnormal findings of blood chemistry: Secondary | ICD-10-CM

## 2017-09-19 DIAGNOSIS — M25571 Pain in right ankle and joints of right foot: Secondary | ICD-10-CM

## 2017-09-19 DIAGNOSIS — E538 Deficiency of other specified B group vitamins: Secondary | ICD-10-CM | POA: Diagnosis not present

## 2017-09-19 DIAGNOSIS — R5383 Other fatigue: Secondary | ICD-10-CM

## 2017-09-19 DIAGNOSIS — N951 Menopausal and female climacteric states: Secondary | ICD-10-CM

## 2017-09-19 MED ORDER — FLUOXETINE HCL 20 MG PO CAPS: 40.0000 mg | ORAL_CAPSULE | Freq: Every day | ORAL | 2 refills | Status: DC

## 2017-09-19 MED ORDER — MELOXICAM 15 MG PO TABS: 15.0000 mg | ORAL_TABLET | Freq: Every day | ORAL | 0 refills | Status: DC

## 2017-09-19 MED ORDER — LEVOTHYROXINE SODIUM 25 MCG PO TABS: 25.0000 ug | ORAL_TABLET | Freq: Every day | ORAL | 2 refills | Status: DC

## 2017-09-19 MED ORDER — METFORMIN HCL 500 MG PO TABS: 500.0000 mg | ORAL_TABLET | Freq: Every day | ORAL | 2 refills | Status: DC

## 2017-09-19 MED ORDER — ROSUVASTATIN CALCIUM 5 MG PO TABS: 5.0000 mg | ORAL_TABLET | Freq: Every day | ORAL | 2 refills | Status: DC

## 2017-09-20 LAB — COMPREHENSIVE METABOLIC PANEL
ALT: 48 U/L — ABNORMAL HIGH (ref 0–35)
AST: 26 U/L (ref 0–37)
Albumin: 4.7 g/dL (ref 3.5–5.2)
Alkaline Phosphatase: 86 U/L (ref 39–117)
BUN: 14 mg/dL (ref 6–23)
CO2: 29 mEq/L (ref 19–32)
Calcium: 10.1 mg/dL (ref 8.4–10.5)
Chloride: 101 mEq/L (ref 96–112)
Creatinine, Ser: 0.83 mg/dL (ref 0.40–1.20)
GFR: 76.51 mL/min (ref >60.00)
Glucose, Bld: 147 mg/dL — ABNORMAL HIGH (ref 70–99)
Potassium: 4.3 mEq/L (ref 3.5–5.1)
Sodium: 139 mEq/L (ref 135–145)
Total Bilirubin: 0.5 mg/dL (ref 0.2–1.2)
Total Protein: 7.1 g/dL (ref 6.0–8.3)

## 2017-09-20 LAB — CBC WITH DIFFERENTIAL/PLATELET
Basophils Absolute: 0 10*3/uL (ref 0.0–0.1)
Basophils Relative: 0.9 % (ref 0.0–3.0)
Eosinophils Absolute: 0.1 10*3/uL (ref 0.0–0.7)
Eosinophils Relative: 1.5 % (ref 0.0–5.0)
HCT: 40.4 % (ref 36.0–46.0)
Hemoglobin: 13.8 g/dL (ref 12.0–15.0)
Lymphocytes Relative: 29.7 % (ref 12.0–46.0)
Lymphs Abs: 1.5 10*3/uL (ref 0.7–4.0)
MCHC: 34.1 g/dL (ref 30.0–36.0)
MCV: 93.8 fl (ref 78.0–100.0)
Monocytes Absolute: 0.3 10*3/uL (ref 0.1–1.0)
Monocytes Relative: 5.8 % (ref 3.0–12.0)
Neutro Abs: 3.2 10*3/uL (ref 1.4–7.7)
Neutrophils Relative %: 62.1 % (ref 43.0–77.0)
Platelets: 216 10*3/uL (ref 150.0–400.0)
RBC: 4.31 Mil/uL (ref 3.87–5.11)
RDW: 12.7 % (ref 11.5–15.5)
WBC: 5.1 10*3/uL (ref 4.0–10.5)

## 2017-09-20 LAB — T4, FREE: Free T4: 0.72 ng/dL (ref 0.60–1.60)

## 2017-09-20 LAB — LIPID PANEL
Cholesterol: 173 mg/dL (ref 0–200)
HDL: 57 mg/dL (ref >39.00)
LDL Cholesterol: 93 mg/dL (ref 0–99)
NonHDL: 116.45
Total CHOL/HDL Ratio: 3
Triglycerides: 118 mg/dL (ref 0.0–149.0)
VLDL: 23.6 mg/dL (ref 0.0–40.0)

## 2017-09-20 LAB — HEMOGLOBIN A1C: Hgb A1c MFr Bld: 6 % (ref 4.6–6.5)

## 2017-09-20 LAB — VITAMIN D 25 HYDROXY (VIT D DEFICIENCY, FRACTURES): VITD: 32.76 ng/mL (ref 30.00–100.00)

## 2017-09-20 LAB — VITAMIN B12: Vitamin B-12: 479 pg/mL (ref 211–911)

## 2017-09-20 LAB — TSH: TSH: 2.12 u[IU]/mL (ref 0.35–4.50)

## 2017-09-21 ENCOUNTER — Encounter: Payer: Self-pay | Admitting: Family Medicine

## 2017-11-04 ENCOUNTER — Ambulatory Visit
Admission: RE | Admit: 2017-11-04 | Discharge: 2017-11-04 | Disposition: A | Discharge status: Home / Self Care | Payer: BLUE CROSS/BLUE SHIELD | Source: Ambulatory Visit | Attending: Obstetrics and Gynecology | Admitting: Obstetrics and Gynecology

## 2017-11-04 DIAGNOSIS — Z1231 Encounter for screening mammogram for malignant neoplasm of breast: Secondary | ICD-10-CM

## 2017-12-20 ENCOUNTER — Ambulatory Visit: Payer: BLUE CROSS/BLUE SHIELD | Admitting: Family Medicine

## 2018-01-03 ENCOUNTER — Ambulatory Visit: Payer: BLUE CROSS/BLUE SHIELD | Admitting: Family Medicine

## 2018-01-03 ENCOUNTER — Encounter: Payer: Self-pay | Admitting: Family Medicine

## 2018-01-03 VITALS — BP 126/74 | HR 91 | Temp 98.1°F | Ht 63.0 in | Wt 221.0 lb

## 2018-01-03 DIAGNOSIS — R945 Abnormal results of liver function studies: Secondary | ICD-10-CM

## 2018-01-03 DIAGNOSIS — M1990 Unspecified osteoarthritis, unspecified site: Secondary | ICD-10-CM | POA: Insufficient documentation

## 2018-01-03 DIAGNOSIS — E669 Obesity, unspecified: Secondary | ICD-10-CM

## 2018-01-03 DIAGNOSIS — R7303 Prediabetes: Secondary | ICD-10-CM | POA: Diagnosis not present

## 2018-01-03 DIAGNOSIS — E039 Hypothyroidism, unspecified: Secondary | ICD-10-CM

## 2018-01-03 DIAGNOSIS — R7989 Other specified abnormal findings of blood chemistry: Secondary | ICD-10-CM

## 2018-01-03 DIAGNOSIS — E785 Hyperlipidemia, unspecified: Secondary | ICD-10-CM | POA: Diagnosis not present

## 2018-01-03 DIAGNOSIS — M1380 Other specified arthritis, unspecified site: Secondary | ICD-10-CM | POA: Insufficient documentation

## 2018-01-03 MED ORDER — ROSUVASTATIN CALCIUM 5 MG PO TABS: 5.0000 mg | ORAL_TABLET | Freq: Every day | ORAL | 2 refills | Status: DC

## 2018-01-03 NOTE — Progress Notes (Signed)
 Terri Jensen is a 52 y.o. female is here for follow up.  History of Present Illness:   Terri Jensen, CMA, scribe for Dr. Wallace.  HPI:   Hyperlipidemia She is currently on Crestor 5mg Is the patient taking medications without problems? [x]  YES  []  NO Does the patient complain of muscle aches?   []  YES  [x]   NO Trying to exercise on a regular basis? [x]  YES  [x]  NO Compliant with diet? []  YES  [x]  NO   Hypothyroidism, unspecified type Patient presents today for followup of hypothyroidism. Patient reports positive compliance with medications. The patient denies any of the following symptoms: fatigue, cold intolerance, constipation, weight gain or inability to lose weight, muscle weakness, mental slowing, dry hair and skin. She is currently taking Levothyroxine 25mcg every morning on empty stomach. TSH 2.12.  Prediabetes A1c 5.9 in 2016. Has gained about 20 pounds since that time. Recheck. She was started on metformin 500mg and has trouble taking at night would like to change to morning.   Also, she has additional complaints of pain in right great toe, with joint becoming larger. No redness, swelling, or heat. No Hx of gout. Hx of psoriatic arthritis. Feels that it is getting worse. Did not trial Mobic due to concern for side effects. Has never see a Rheumatologist.  Depression screen PHQ 2/9 09/20/2017 04/05/2016 04/05/2016  Decreased Interest 0 0 0  Down, Depressed, Hopeless 0 0 0  PHQ - 2 Score 0 0 0  Altered sleeping 1 - -  Tired, decreased energy 1 - -  Change in appetite 3 - -  Trouble concentrating 0 - -  Moving slowly or fidgety/restless 0 - -  Suicidal thoughts 0 - -  PHQ-9 Score 5 - -   PMHx, SurgHx, SocialHx, FamHx, Medications, and Allergies were reviewed in the Visit Navigator and updated as appropriate.   Patient Active Problem List   Diagnosis Date Noted  . LFTs abnormal 01/03/2018  . Arthritis 01/03/2018  . Hypothyroidism 08/05/2016  . Low vitamin D  level 04/05/2016  . Prediabetes 04/05/2016  . Perimenopause 04/05/2016  . Obesity (BMI 35.0-39.9 without comorbidity) 04/05/2016  . Hyperlipidemia 04/05/2016  . Encounter for cosmetic surgery 05/05/2015  . Kidney stones    Social History   Tobacco Use  . Smoking status: Former Smoker    Packs/day: 1.00    Years: 7.00    Pack years: 7.00    Types: Cigarettes    Last attempt to quit: 04/23/1990    Years since quitting: 27.7  . Smokeless tobacco: Never Used  Substance Use Topics  . Alcohol use: Yes    Alcohol/week: 6.0 - 8.0 standard drinks    Types: 2 Standard drinks or equivalent, 4 - 6 Glasses of wine per week  . Drug use: No   Current Medications and Allergies:   .  FLUoxetine (PROZAC) 20 MG capsule, Take 2 capsules (40 mg total) by mouth daily., Disp: 60 capsule, Rfl: 2 .  levothyroxine (SYNTHROID, LEVOTHROID) 25 MCG tablet, Take 1 tablet (25 mcg total) by mouth daily before breakfast. .  metFORMIN (GLUCOPHAGE) 500 MG tablet, Take 1 tablet (500 mg total) by mouth at bedtime., Disp: 30 tablet, Rfl: 2 .  Multiple Vitamins-Minerals (MULTIVITAMIN ADULT) CHEW, Chew 2 each by mouth daily., Disp: , Rfl:  .  rosuvastatin (CRESTOR) 5 MG tablet, Take 1 tablet (5 mg total) by mouth daily., Disp: 30 tablet, Rfl: 2 .    tretinoin (RETIN-A) 0.1 % cream, APPLY TO AFFECTED AREA EVERY DAY, Disp: , Rfl:  .  vitamin B-12 (CYANOCOBALAMIN) 1000 MCG tablet, Take 1,000 mcg by mouth daily., Disp: , Rfl:  .  YUVAFEM 10 MCG TABS vaginal tablet, , Disp: , Rfl:    Allergies  Allergen Reactions  . Shellfish Allergy Anaphylaxis  . Adhesive [Tape] Rash  . Codeine Nausea And Vomiting  . Sulfa Antibiotics Rash    As a young child   Review of Systems   Pertinent items are noted in the HPI. Otherwise, ROS is negative.  Vitals:   Vitals:   01/03/18 1559  BP: 126/74  Pulse: 91  Temp: 98.1 F (36.7 C)  TempSrc: Oral  SpO2: 97%  Weight: 221 lb (100.2 kg)  Height: 5' 3" (1.6 m)     Body mass  index is 39.15 kg/m.  Physical Exam:   Physical Exam  Constitutional: She is oriented to person, place, and time. She appears well-developed and well-nourished. No distress.  HENT:  Head: Normocephalic and atraumatic.  Right Ear: External ear normal.  Left Ear: External ear normal.  Nose: Nose normal.  Mouth/Throat: Oropharynx is clear and moist.  Eyes: Pupils are equal, round, and reactive to light. Conjunctivae and EOM are normal.  Neck: Normal range of motion. Neck supple. No thyromegaly present.  Cardiovascular: Normal rate, regular rhythm, normal heart sounds and intact distal pulses.  Pulmonary/Chest: Effort normal and breath sounds normal.  Abdominal: Soft. Bowel sounds are normal.  Musculoskeletal: Normal range of motion.  Right great toe IP joint ttp, with FROM and no erythema or edema or heat.  Lymphadenopathy:    She has no cervical adenopathy.  Neurological: She is alert and oriented to person, place, and time.  Skin: Skin is warm and dry. Capillary refill takes less than 2 seconds.  Psychiatric: She has a normal mood and affect. Her behavior is normal.  Nursing note and vitals reviewed.  Assessment and Plan:   Dior was seen today for follow-up.  Diagnoses and all orders for this visit:  Arthritis Comments: Hx of psoriasis. Patient does not want to take NSAIDs. She is interested in seeing Rheumatology. Orders: -     Ambulatory referral to Rheumatology  Hyperlipidemia, unspecified hyperlipidemia type -     rosuvastatin (CRESTOR) 5 MG tablet; Take 1 tablet (5 mg total) by mouth daily.  Prediabetes Comments: Currently on Metformin. Will check A1c. Will consider Metformin 750 ER v GLP1RA. Orders: -     Hemoglobin A1c  LFTs abnormal -     Comp Met (CMET)  Acquired hypothyroidism  Obesity (BMI 35.0-39.9 without comorbidity)   . Reviewed expectations re: course of current medical issues. . Discussed self-management of symptoms. . Outlined signs and  symptoms indicating need for more acute intervention. . Patient verbalized understanding and all questions were answered. Marland Kitchen Health Maintenance issues including appropriate healthy diet, exercise, and smoking avoidance were discussed with patient. . See orders for this visit as documented in the electronic medical record. . Patient received an After Visit Summary.  CMA served as Education administrator during this visit. History, Physical, and Plan performed by medical provider. The above documentation has been reviewed and is accurate and complete. Briscoe Deutscher, D.O.  Briscoe Deutscher, DO [place] 01/03/2018

## 2018-01-04 LAB — COMPREHENSIVE METABOLIC PANEL
ALT: 38 U/L — ABNORMAL HIGH (ref 0–35)
AST: 25 U/L (ref 0–37)
Albumin: 4.7 g/dL (ref 3.5–5.2)
Alkaline Phosphatase: 83 U/L (ref 39–117)
BUN: 19 mg/dL (ref 6–23)
CO2: 27 mEq/L (ref 19–32)
Calcium: 10 mg/dL (ref 8.4–10.5)
Chloride: 102 mEq/L (ref 96–112)
Creatinine, Ser: 0.96 mg/dL (ref 0.40–1.20)
GFR: 64.62 mL/min (ref >60.00)
Glucose, Bld: 101 mg/dL — ABNORMAL HIGH (ref 70–99)
Potassium: 4.2 mEq/L (ref 3.5–5.1)
Sodium: 138 mEq/L (ref 135–145)
Total Bilirubin: 0.3 mg/dL (ref 0.2–1.2)
Total Protein: 7.4 g/dL (ref 6.0–8.3)

## 2018-01-04 LAB — HEMOGLOBIN A1C: Hgb A1c MFr Bld: 5.9 % (ref 4.6–6.5)

## 2018-01-08 ENCOUNTER — Other Ambulatory Visit: Payer: Self-pay | Admitting: Family Medicine

## 2018-01-08 DIAGNOSIS — E785 Hyperlipidemia, unspecified: Secondary | ICD-10-CM

## 2018-01-08 MED ORDER — METFORMIN HCL ER 750 MG PO TB24
750.0000 mg | ORAL_TABLET | Freq: Two times a day (BID) | ORAL | 2 refills | Status: DC
Start: 2018-01-08 — End: 2018-11-27

## 2018-01-08 NOTE — Addendum Note (Signed)
Addended by: Briscoe Deutscher R on: 01/08/2018 09:44 AM   Modules accepted: Orders

## 2018-01-09 ENCOUNTER — Encounter: Payer: Self-pay | Admitting: Family Medicine

## 2018-02-21 DIAGNOSIS — M5416 Radiculopathy, lumbar region: Secondary | ICD-10-CM | POA: Insufficient documentation

## 2018-03-02 DIAGNOSIS — M5126 Other intervertebral disc displacement, lumbar region: Secondary | ICD-10-CM | POA: Insufficient documentation

## 2018-03-04 ENCOUNTER — Other Ambulatory Visit: Payer: Self-pay | Admitting: Family Medicine

## 2018-03-04 DIAGNOSIS — E039 Hypothyroidism, unspecified: Secondary | ICD-10-CM

## 2018-04-07 ENCOUNTER — Ambulatory Visit: Payer: BLUE CROSS/BLUE SHIELD | Admitting: Family Medicine

## 2018-04-24 NOTE — Progress Notes (Signed)
Terri Jensen is a 53 y.o. female is here for follow up.  Assessment and Plan:   Insulin resistance, Rx Metformin Lab Results  Component Value Date   HGBA1C 5.8 04/26/2018   HGBA1C 5.9 01/03/2018   HGBA1C 6.0 09/19/2017   Lab Results  Component Value Date   LDLCALC 179 (H) 04/26/2018   CREATININE 0.74 04/26/2018   Well controlled.  No signs of complications, medication side effects, or red flags.  Continue current regimen.    Low vitamin D level Still < 30. Will recommend vitamin D 2000 international units daily.  LFTs abnormal Lab Results  Component Value Date   ALT 43 (H) 04/26/2018   AST 23 04/26/2018   ALKPHOS 87 04/26/2018   BILITOT 0.5 04/26/2018   Review of the chart shows an MRI of the abdomen from 2016. Hepatobiliary: 8 mm nonenhancing cystic lesion along the posterior-inferior old -lateral margin of segment 6 of the liver, image 5 series 3, appears benign. Cholecystectomy noted.  Since ALT is elevated, high suspicion for fatty liver.  Insulin level and lipids should be controlled aggressively.  Weight loss important.  Hyperlipidemia Patient was previously taking Crestor 5 mg p.o. nightly.  She reports that the orthopedic PA recommended that she stop taking the Crestor in case it was causing joint pain.  Her lipid panel has worsened significantly as evidenced below.  I advised her to restart the medication and to note whether or not she has myalgias associated with this medication.  Lab Results  Component Value Date   CHOL 267 (H) 04/26/2018   CHOL 173 09/19/2017   CHOL 203 (A) 09/25/2015   Lab Results  Component Value Date   HDL 60.50 04/26/2018   HDL 57.00 09/19/2017   HDL 73 (A) 09/25/2015   Lab Results  Component Value Date   LDLCALC 179 (H) 04/26/2018   LDLCALC 93 09/19/2017   LDLCALC 116 09/25/2015   Lab Results  Component Value Date   TRIG 137.0 04/26/2018   TRIG 118.0 09/19/2017   TRIG 71 09/25/2015   Lab Results  Component Value Date     CHOLHDL 4 04/26/2018   CHOLHDL 3 09/19/2017    Arthralgia of multiple joints Patient with multiple joint pain.  Previously concerning for rheumatoid arthritis.  I referred her to rheumatologist.  Labs were negative but physician felt that her joint pains were fairly consistent with rheumatoid arthritis and offered Plaquenil.  She deferred at this time due to a medication side effect profile.  She started the meloxicam that was prescribed by me at her last visit shortly after and felt improvement in joint pain.  She is no longer taking that medication.  We discussed the fact that this is a safe medication to use as needed.  We will re-prescribe.  Hypothyroidism No concerns today.  Labs today.  Will make sure that she is taking her levothyroxine consistently and without other medications.  Lab Results  Component Value Date   TSH 4.85 (H) 04/26/2018   FREET4 0.72 04/26/2018    Lab Results  Component Value Date   TSH 4.85 (H) 04/26/2018   TSH 2.12 09/19/2017   TSH 3.03 09/16/2016   TSH 4.88 (H) 08/04/2016   TSH 5.06 (H) 04/05/2016   FREET4 0.72 04/26/2018   FREET4 0.72 09/19/2017   FREET4 0.91 09/16/2016   FREET4 0.49 (L) 08/04/2016   FREET4 0.68 04/05/2016     Orders Placed This Encounter  Procedures  . Comprehensive metabolic panel  . Lipid  panel  . TSH  . Vitamin B12  . VITAMIN D 25 Hydroxy (Vit-D Deficiency, Fractures)  . T4, free  . CBC with Differential/Platelet  . Comprehensive metabolic panel  . Lipid panel  . Hemoglobin A1c  . TSH  . T4, free  . Vitamin B12  . VITAMIN D 25 Hydroxy (Vit-D Deficiency, Fractures)   Subjective:   HPI: See Assessment and Plan section for Problem Based Charting of issues discussed today.  Health Maintenance:  There are no preventive care reminders to display for this patient. Depression screen [facility] 2/9 04/25/2018 09/20/2017 04/05/2016  Decreased Interest 0 0 0  Down, Depressed, Hopeless 0 0 0  PHQ - 2 Score 0 0 0  Altered  sleeping 1 1 -  Tired, decreased energy 1 1 -  Change in appetite 0 3 -  Feeling bad or failure about yourself  0 - -  Trouble concentrating 0 0 -  Moving slowly or fidgety/restless 0 0 -  Suicidal thoughts 0 0 -  PHQ-9 Score 2 5 -  Difficult doing work/chores Not difficult at all - -   PMHx, SurgHx, SocialHx, FamHx, Medications, and Allergies were reviewed in the Visit Navigator and updated as appropriate.   Patient Active Problem List   Diagnosis Date Noted  . Arthralgia of multiple joints 04/30/2018  . Prolapsed lumbar disc 03/02/2018  . Lumbar radiculopathy 02/21/2018  . LFTs abnormal 01/03/2018  . Hypothyroidism 08/05/2016  . Low vitamin D level 04/05/2016  . Insulin resistance, Rx Metformin 04/05/2016  . Perimenopause 04/05/2016  . Hyperlipidemia 04/05/2016  . Kidney stones    Social History   Tobacco Use  . Smoking status: Former Smoker    Packs/day: 1.00    Years: 7.00    Pack years: 7.00    Types: Cigarettes    Last attempt to quit: 04/23/1990    Years since quitting: 28.0  . Smokeless tobacco: Never Used  Substance Use Topics  . Alcohol use: Yes    Alcohol/week: 6.0 - 8.0 standard drinks    Types: 2 Standard drinks or equivalent, 4 - 6 Glasses of wine per week  . Drug use: No   Current Medications and Allergies:   .  Estradiol (IMVEXXY MAINTENANCE PACK) 4 MCG INST, Imvexxy Maintenance Pack 4 mcg vaginal insert, Disp: , Rfl:  .  FLUoxetine (PROZAC) 20 MG capsule, Take 2 capsules (40 mg total) by mouth daily., Disp: 60 capsule, Rfl: 2 .  levothyroxine (SYNTHROID, LEVOTHROID) 25 MCG tablet, TAKE 1 TABLET (25 MCG TOTAL) BY MOUTH DAILY BEFORE BREAKFAST., Disp: 30 tablet, Rfl: 5 .  metFORMIN (GLUCOPHAGE XR) 750 MG 24 hr tablet, Take 1 tablet (750 mg total) by mouth 2 (two) times daily., Disp: 60 tablet, Rfl: 2 .  Multiple Vitamins-Minerals (MULTIVITAMIN ADULT) CHEW, Chew 2 each by mouth daily., Disp: , Rfl:  .  rosuvastatin (CRESTOR) 5 MG tablet, TAKE 1 TABLET BY  MOUTH EVERY DAY, Disp: 30 tablet, Rfl: 2 .  tretinoin (RETIN-A) 0.1 % cream, APPLY TO AFFECTED AREA EVERY DAY, Disp: , Rfl:  .  vitamin B-12 (CYANOCOBALAMIN) 1000 MCG tablet, Take 1,000 mcg by mouth daily., Disp: , Rfl:  .  EPINEPHrine 0.3 mg/0.3 mL IJ SOAJ injection, epinephrine 0.3 mg/0.3 mL injection, auto-injector, Disp: , Rfl:    Allergies  Allergen Reactions  . Shellfish Allergy Anaphylaxis  . Tape Rash  . Codeine Nausea And Vomiting  . Sulfa Antibiotics Rash    As a young child   Review of Systems  Pertinent items are noted in the HPI. Otherwise, ROS is negative.  Vitals:   Vitals:   04/25/18 1504  BP: 102/82  Pulse: (!) 101  SpO2: 94%  Weight: 218 lb 6.4 oz (99.1 kg)  Height: 5\' 3"  (1.6 m)     Body mass index is 38.69 kg/m. Physical Exam:   General: Cooperative, alert and oriented, well developed, well nourished, in no acute distress. HEENT: Pupils equal round reactive light and extraocular movements intact. Conjunctivae and lids unremarkable. No pallor or cyanosis, dentition good. Neck: No thyromegaly.  Cardiovascular: Regular rhythm. No murmurs appreciated.  Lungs: Normal work of breathing. Clear bilaterally without rales, rhonchi, or wheezing.  Abdomen: Soft, nontender, no masses. Normal bowel sounds. Extremities: No clubbing, cyanosis, erythema. No edema.  Skin: Warm and dry. Neurologic: No focal deficits.  Psychiatric: Normal affect and thought content.   . Reviewed expectations re: course of current medical issues. . Discussed self-management of symptoms. . Outlined signs and symptoms indicating need for more acute intervention. . Patient verbalized understanding and all questions were answered. Marland Kitchen Health Maintenance issues including appropriate healthy diet, exercise, and smoking avoidance were discussed with patient. . See orders for this visit as documented in the electronic medical record. . Patient received an After Visit Summary.  Briscoe Deutscher,  DO [place], [place] [facility] 04/30/2018

## 2018-04-25 ENCOUNTER — Encounter: Payer: Self-pay | Admitting: Family Medicine

## 2018-04-25 ENCOUNTER — Ambulatory Visit: Payer: BLUE CROSS/BLUE SHIELD | Admitting: Family Medicine

## 2018-04-25 VITALS — BP 102/82 | HR 101 | Ht 63.0 in | Wt 218.4 lb

## 2018-04-25 DIAGNOSIS — R945 Abnormal results of liver function studies: Secondary | ICD-10-CM

## 2018-04-25 DIAGNOSIS — E039 Hypothyroidism, unspecified: Secondary | ICD-10-CM | POA: Diagnosis not present

## 2018-04-25 DIAGNOSIS — E538 Deficiency of other specified B group vitamins: Secondary | ICD-10-CM

## 2018-04-25 DIAGNOSIS — E669 Obesity, unspecified: Secondary | ICD-10-CM

## 2018-04-25 DIAGNOSIS — M2550 Pain in unspecified joint: Secondary | ICD-10-CM

## 2018-04-25 DIAGNOSIS — E785 Hyperlipidemia, unspecified: Secondary | ICD-10-CM

## 2018-04-25 DIAGNOSIS — R7989 Other specified abnormal findings of blood chemistry: Secondary | ICD-10-CM

## 2018-04-25 DIAGNOSIS — R7303 Prediabetes: Secondary | ICD-10-CM

## 2018-04-26 ENCOUNTER — Other Ambulatory Visit: Payer: Self-pay

## 2018-04-26 ENCOUNTER — Other Ambulatory Visit (INDEPENDENT_AMBULATORY_CARE_PROVIDER_SITE_OTHER): Payer: BLUE CROSS/BLUE SHIELD

## 2018-04-26 DIAGNOSIS — R945 Abnormal results of liver function studies: Secondary | ICD-10-CM | POA: Diagnosis not present

## 2018-04-26 DIAGNOSIS — R7989 Other specified abnormal findings of blood chemistry: Secondary | ICD-10-CM | POA: Diagnosis not present

## 2018-04-26 DIAGNOSIS — R7303 Prediabetes: Secondary | ICD-10-CM

## 2018-04-26 DIAGNOSIS — E785 Hyperlipidemia, unspecified: Secondary | ICD-10-CM | POA: Diagnosis not present

## 2018-04-26 DIAGNOSIS — E669 Obesity, unspecified: Secondary | ICD-10-CM

## 2018-04-26 DIAGNOSIS — E039 Hypothyroidism, unspecified: Secondary | ICD-10-CM

## 2018-04-26 DIAGNOSIS — E538 Deficiency of other specified B group vitamins: Secondary | ICD-10-CM

## 2018-04-26 LAB — CBC WITH DIFFERENTIAL/PLATELET
Basophils Absolute: 0 10*3/uL (ref 0.0–0.1)
Basophils Relative: 1 % (ref 0.0–3.0)
Eosinophils Absolute: 0.1 10*3/uL (ref 0.0–0.7)
Eosinophils Relative: 1.9 % (ref 0.0–5.0)
HCT: 41.1 % (ref 36.0–46.0)
Hemoglobin: 14.2 g/dL (ref 12.0–15.0)
Lymphocytes Relative: 39.6 % (ref 12.0–46.0)
Lymphs Abs: 1.5 10*3/uL (ref 0.7–4.0)
MCHC: 34.6 g/dL (ref 30.0–36.0)
MCV: 92.8 fl (ref 78.0–100.0)
Monocytes Absolute: 0.3 10*3/uL (ref 0.1–1.0)
Monocytes Relative: 8 % (ref 3.0–12.0)
Neutro Abs: 1.8 10*3/uL (ref 1.4–7.7)
Neutrophils Relative %: 49.5 % (ref 43.0–77.0)
Platelets: 229 10*3/uL (ref 150.0–400.0)
RBC: 4.43 Mil/uL (ref 3.87–5.11)
RDW: 12.2 % (ref 11.5–15.5)
WBC: 3.7 10*3/uL — ABNORMAL LOW (ref 4.0–10.5)

## 2018-04-26 LAB — COMPREHENSIVE METABOLIC PANEL
ALT: 43 U/L — ABNORMAL HIGH (ref 0–35)
AST: 23 U/L (ref 0–37)
Albumin: 4.6 g/dL (ref 3.5–5.2)
Alkaline Phosphatase: 87 U/L (ref 39–117)
BUN: 20 mg/dL (ref 6–23)
CO2: 24 mEq/L (ref 19–32)
Calcium: 9.2 mg/dL (ref 8.4–10.5)
Chloride: 104 mEq/L (ref 96–112)
Creatinine, Ser: 0.74 mg/dL (ref 0.40–1.20)
GFR: 82 mL/min (ref >60.00)
Glucose, Bld: 109 mg/dL — ABNORMAL HIGH (ref 70–99)
Potassium: 4.3 mEq/L (ref 3.5–5.1)
Sodium: 138 mEq/L (ref 135–145)
Total Bilirubin: 0.5 mg/dL (ref 0.2–1.2)
Total Protein: 7 g/dL (ref 6.0–8.3)

## 2018-04-26 LAB — LIPID PANEL
Cholesterol: 267 mg/dL — ABNORMAL HIGH (ref 0–200)
HDL: 60.5 mg/dL (ref >39.00)
LDL Cholesterol: 179 mg/dL — ABNORMAL HIGH (ref 0–99)
NonHDL: 206.29
Total CHOL/HDL Ratio: 4
Triglycerides: 137 mg/dL (ref 0.0–149.0)
VLDL: 27.4 mg/dL (ref 0.0–40.0)

## 2018-04-26 LAB — VITAMIN B12: Vitamin B-12: 451 pg/mL (ref 211–911)

## 2018-04-26 LAB — VITAMIN D 25 HYDROXY (VIT D DEFICIENCY, FRACTURES): VITD: 28.56 ng/mL — ABNORMAL LOW (ref 30.00–100.00)

## 2018-04-26 LAB — T4, FREE: Free T4: 0.72 ng/dL (ref 0.60–1.60)

## 2018-04-26 LAB — TSH: TSH: 4.85 u[IU]/mL — ABNORMAL HIGH (ref 0.35–4.50)

## 2018-04-26 LAB — HEMOGLOBIN A1C: Hgb A1c MFr Bld: 5.8 % (ref 4.6–6.5)

## 2018-04-26 NOTE — Addendum Note (Signed)
Addended by: Francis Dowse T on: 04/26/2018 08:36 AM   Modules accepted: Orders

## 2018-04-30 ENCOUNTER — Encounter: Payer: Self-pay | Admitting: Family Medicine

## 2018-04-30 DIAGNOSIS — M2550 Pain in unspecified joint: Secondary | ICD-10-CM | POA: Insufficient documentation

## 2018-04-30 NOTE — Assessment & Plan Note (Addendum)
Lab Results  Component Value Date   HGBA1C 5.8 04/26/2018   HGBA1C 5.9 01/03/2018   HGBA1C 6.0 09/19/2017   Lab Results  Component Value Date   LDLCALC 179 (H) 04/26/2018   CREATININE 0.74 04/26/2018   Well controlled.  No signs of complications, medication side effects, or red flags.  Continue current regimen.

## 2018-04-30 NOTE — Assessment & Plan Note (Signed)
No concerns today.  Labs today.  Will make sure that she is taking her levothyroxine consistently and without other medications.  Lab Results  Component Value Date   TSH 4.85 (H) 04/26/2018   FREET4 0.72 04/26/2018    Lab Results  Component Value Date   TSH 4.85 (H) 04/26/2018   TSH 2.12 09/19/2017   TSH 3.03 09/16/2016   TSH 4.88 (H) 08/04/2016   TSH 5.06 (H) 04/05/2016   FREET4 0.72 04/26/2018   FREET4 0.72 09/19/2017   FREET4 0.91 09/16/2016   FREET4 0.49 (L) 08/04/2016   FREET4 0.68 04/05/2016

## 2018-04-30 NOTE — Assessment & Plan Note (Signed)
Still < 30. Will recommend vitamin D 2000 international units daily.

## 2018-04-30 NOTE — Assessment & Plan Note (Signed)
Patient with multiple joint pain.  Previously concerning for rheumatoid arthritis.  I referred her to rheumatologist.  Labs were negative but physician felt that her joint pains were fairly consistent with rheumatoid arthritis and offered Plaquenil.  She deferred at this time due to a medication side effect profile.  She started the meloxicam that was prescribed by me at her last visit shortly after and felt improvement in joint pain.  She is no longer taking that medication.  We discussed the fact that this is a safe medication to use as needed.  We will re-prescribe.

## 2018-04-30 NOTE — Assessment & Plan Note (Signed)
Patient was previously taking Crestor 5 mg p.o. nightly.  She reports that the orthopedic PA recommended that she stop taking the Crestor in case it was causing joint pain.  Her lipid panel has worsened significantly as evidenced below.  I advised her to restart the medication and to note whether or not she has myalgias associated with this medication.  Lab Results  Component Value Date   CHOL 267 (H) 04/26/2018   CHOL 173 09/19/2017   CHOL 203 (A) 09/25/2015   Lab Results  Component Value Date   HDL 60.50 04/26/2018   HDL 57.00 09/19/2017   HDL 73 (A) 09/25/2015   Lab Results  Component Value Date   LDLCALC 179 (H) 04/26/2018   LDLCALC 93 09/19/2017   LDLCALC 116 09/25/2015   Lab Results  Component Value Date   TRIG 137.0 04/26/2018   TRIG 118.0 09/19/2017   TRIG 71 09/25/2015   Lab Results  Component Value Date   CHOLHDL 4 04/26/2018   CHOLHDL 3 09/19/2017

## 2018-04-30 NOTE — Assessment & Plan Note (Signed)
Lab Results  Component Value Date   ALT 43 (H) 04/26/2018   AST 23 04/26/2018   ALKPHOS 87 04/26/2018   BILITOT 0.5 04/26/2018   Review of the chart shows an MRI of the abdomen from 2016. Hepatobiliary: 8 mm nonenhancing cystic lesion along the posterior-inferior old -lateral margin of segment 6 of the liver, image 5 series 3, appears benign. Cholecystectomy noted.  Since ALT is elevated, high suspicion for fatty liver.  Insulin level and lipids should be controlled aggressively.  Weight loss important.

## 2018-05-02 ENCOUNTER — Other Ambulatory Visit: Payer: Self-pay

## 2018-05-02 MED ORDER — VITAMIN D (ERGOCALCIFEROL) 1.25 MG (50000 UNIT) PO CAPS: 50000.0000 [IU] | ORAL_CAPSULE | ORAL | 0 refills | Status: DC

## 2018-06-23 ENCOUNTER — Other Ambulatory Visit: Payer: Self-pay | Admitting: Family Medicine

## 2018-06-23 DIAGNOSIS — R7303 Prediabetes: Secondary | ICD-10-CM

## 2018-06-23 DIAGNOSIS — N951 Menopausal and female climacteric states: Secondary | ICD-10-CM

## 2018-07-17 LAB — HEPATIC FUNCTION PANEL
ALT: 63 — AB (ref 7–35)
AST: 27 (ref 13–35)
Alkaline Phosphatase: 94 (ref 25–125)
Bilirubin, Total: 0.3

## 2018-07-17 LAB — BASIC METABOLIC PANEL
BUN: 18 (ref 4–21)
Creatinine: 0.9 (ref 0.5–1.1)
Glucose: 93
Potassium: 4.6 (ref 3.4–5.3)
Sodium: 141 (ref 137–147)

## 2018-08-06 ENCOUNTER — Other Ambulatory Visit: Payer: Self-pay

## 2018-08-06 DIAGNOSIS — M2550 Pain in unspecified joint: Secondary | ICD-10-CM

## 2018-08-06 DIAGNOSIS — M79643 Pain in unspecified hand: Secondary | ICD-10-CM | POA: Insufficient documentation

## 2018-08-06 DIAGNOSIS — M79641 Pain in right hand: Secondary | ICD-10-CM

## 2018-08-06 DIAGNOSIS — M25569 Pain in unspecified knee: Secondary | ICD-10-CM

## 2018-08-06 DIAGNOSIS — R21 Rash and other nonspecific skin eruption: Secondary | ICD-10-CM

## 2018-08-06 DIAGNOSIS — M79673 Pain in unspecified foot: Secondary | ICD-10-CM | POA: Insufficient documentation

## 2018-08-06 DIAGNOSIS — M79671 Pain in right foot: Secondary | ICD-10-CM

## 2018-09-08 ENCOUNTER — Other Ambulatory Visit: Payer: Self-pay | Admitting: Family Medicine

## 2018-09-08 DIAGNOSIS — M25571 Pain in right ankle and joints of right foot: Secondary | ICD-10-CM

## 2018-10-03 ENCOUNTER — Other Ambulatory Visit: Payer: Self-pay | Admitting: Family Medicine

## 2018-10-03 DIAGNOSIS — E039 Hypothyroidism, unspecified: Secondary | ICD-10-CM

## 2018-10-03 NOTE — Telephone Encounter (Signed)
Last fill 03/05/18  #30/5 Last OV 04/25/18

## 2018-10-09 ENCOUNTER — Other Ambulatory Visit: Payer: Self-pay | Admitting: Obstetrics and Gynecology

## 2018-10-09 DIAGNOSIS — Z1231 Encounter for screening mammogram for malignant neoplasm of breast: Secondary | ICD-10-CM

## 2018-11-21 ENCOUNTER — Other Ambulatory Visit: Payer: Self-pay

## 2018-11-21 ENCOUNTER — Ambulatory Visit
Admission: RE | Admit: 2018-11-21 | Discharge: 2018-11-21 | Disposition: A | Discharge status: Home / Self Care | Payer: BC Managed Care – PPO | Source: Ambulatory Visit | Attending: Obstetrics and Gynecology | Admitting: Obstetrics and Gynecology

## 2018-11-21 DIAGNOSIS — Z1231 Encounter for screening mammogram for malignant neoplasm of breast: Secondary | ICD-10-CM

## 2018-11-21 LAB — HM MAMMOGRAPHY

## 2018-11-22 DIAGNOSIS — M7989 Other specified soft tissue disorders: Secondary | ICD-10-CM | POA: Insufficient documentation

## 2018-11-26 NOTE — Progress Notes (Signed)
Virtual Visit via Video   Due to the COVID-19 pandemic, this visit was completed with telemedicine (audio/video) technology to reduce patient and provider exposure as well as to preserve personal protective equipment.   I connected with Terri Jensen by a video enabled telemedicine application and verified that I am speaking with the correct person using two identifiers. Location patient: Home Location provider: [facility], Office Persons participating in the virtual visit: [place], [place], DO [name], CMA acting as scribe for Dr. Briscoe Deutscher.    I discussed the limitations of evaluation and management by telemedicine and the availability of in person appointments. The patient expressed understanding and agreed to proceed.  Care Team   Patient Care Team: Briscoe Deutscher, DO as PCP - General (Family Medicine) Juanita Craver, MD as Consulting Physician (Gastroenterology) [place], Jennefer Bravo, MD as Referring Physician (Dermatology) Loleta Books, MD as Consulting Physician (Ophthalmology) Delsa Bern, MD as Consulting Physician (Obstetrics and Gynecology) Lahoma Rocker, MD as Consulting Physician (Rheumatology)  Subjective:   HPI: Patient would like to have Vitamin D and thyroid checked. She has had increased fatigue over the last few months.    1. Vitamin D deficiency. Hx of high dose supplementation. Felt more energy after supplementing. Due for recheck.   2. Hyperlipidemia.  Lab Results  Component Value Date   CHOL 165 11/28/2018   HDL 59.30 11/28/2018   LDLCALC 92 11/28/2018   TRIG 71.0 11/28/2018   CHOLHDL 3 11/28/2018   Lab Results  Component Value Date   ALT 46 (H) 11/28/2018   AST 26 11/28/2018   ALKPHOS 80 11/28/2018   BILITOT 0.5 11/28/2018   MRI abdomen in 2016: There is an 8 mm nonenhancing cystic lesion along the capsule of segment 6 of the liver which appears benign and likely incidental.   3. Prediabetes.    Lab Results  Component Value Date   HGBA1C 5.8 04/26/2018     4. Hypothyroidism.  Lab Results  Component Value Date   TSH 2.90 11/28/2018   FREET4 0.91 11/28/2018   Lab Results  Component Value Date   TSH 2.90 11/28/2018   TSH 4.85 (H) 04/26/2018   TSH 2.12 09/19/2017   TSH 3.03 09/16/2016   TSH 4.88 (H) 08/04/2016   TSH 5.06 (H) 04/05/2016   FREET4 0.91 11/28/2018   FREET4 0.72 04/26/2018   FREET4 0.72 09/19/2017   FREET4 0.91 09/16/2016   FREET4 0.49 (L) 08/04/2016   FREET4 0.68 04/05/2016      5. Perimenopause. Followed by GYN for HRT.    Review of Systems  All other systems reviewed and are negative.   Patient Active Problem List   Diagnosis Date Noted  . Swelling of right foot 11/22/2018  . Hand pain 08/06/2018  . Foot pain 08/06/2018  . Knee pain 08/06/2018  . Rash 08/06/2018  . Arthralgia of multiple joints 04/30/2018  . Prolapsed lumbar disc 03/02/2018  . Lumbar radiculopathy 02/21/2018  . LFTs abnormal 01/03/2018  . Inflammatory arthritis 01/03/2018  . Hypothyroidism 08/05/2016  . Low vitamin D level 04/05/2016  . Insulin resistance, Rx Metformin 04/05/2016  . Perimenopause 04/05/2016  . Hyperlipidemia 04/05/2016  . Kidney stones     Social History   Tobacco Use  . Smoking status: Former Smoker    Packs/day: 1.00    Years: 7.00    Pack years: 7.00    Types: Cigarettes    Quit date: 04/23/1990    Years since quitting: 28.6  .  Smokeless tobacco: Never Used  Substance Use Topics  . Alcohol use: Yes    Alcohol/week: 6.0 - 8.0 standard drinks    Types: 2 Standard drinks or equivalent, 4 - 6 Glasses of wine per week   Current Outpatient Medications:  .  EPINEPHrine 0.3 mg/0.3 mL IJ SOAJ injection, epinephrine 0.3 mg/0.3 mL injection, auto-injector, Disp: , Rfl:  .  Estradiol (IMVEXXY MAINTENANCE PACK) 4 MCG INST, Imvexxy Maintenance Pack 4 mcg vaginal insert, Disp: , Rfl:  .  FLUoxetine (PROZAC) 20 MG capsule, TAKE 2 CAPSULES BY MOUTH EVERY DAY,  Disp: 60 capsule, Rfl: 2 .  levothyroxine (SYNTHROID) 25 MCG tablet, TAKE 1 TABLET (25 MCG TOTAL) BY MOUTH DAILY BEFORE BREAKFAST., Disp: 30 tablet, Rfl: 5 .  metFORMIN (GLUCOPHAGE XR) 750 MG 24 hr tablet, Take 1 tablet (750 mg total) by mouth 2 (two) times daily., Disp: 60 tablet, Rfl: 2 .  Multiple Vitamins-Minerals (MULTIVITAMIN ADULT) CHEW, Chew 2 each by mouth daily., Disp: , Rfl:  .  rosuvastatin (CRESTOR) 5 MG tablet, TAKE 1 TABLET BY MOUTH EVERY DAY, Disp: 30 tablet, Rfl: 2 .  tretinoin (RETIN-A) 0.1 % cream, APPLY TO AFFECTED AREA EVERY DAY, Disp: , Rfl:  .  vitamin B-12 (CYANOCOBALAMIN) 1000 MCG tablet, Take 1,000 mcg by mouth daily., Disp: , Rfl:  .  Vitamin D, Ergocalciferol, (DRISDOL) 1.25 MG (50000 UT) CAPS capsule, Take 1 capsule (50,000 Units total) by mouth every 7 (seven) days., Disp: 12 capsule, Rfl: 0  Allergies  Allergen Reactions  . Shellfish Allergy Anaphylaxis  . Tape Rash  . Codeine Nausea And Vomiting  . Sulfa Antibiotics Rash    As a young child   Objective:   VITALS: Per patient if applicable, see vitals. GENERAL: Alert, appears well and in no acute distress. HEENT: Atraumatic, conjunctiva clear, no obvious abnormalities on inspection of external nose and ears. NECK: Normal movements of the head and neck. CARDIOPULMONARY: No increased WOB. Speaking in clear sentences. I:E ratio WNL.  MS: Moves all visible extremities without noticeable abnormality. PSYCH: Pleasant and cooperative, well-groomed. Speech normal rate and rhythm. Affect is appropriate. Insight and judgement are appropriate. Attention is focused, linear, and appropriate.  NEURO: CN grossly intact. Oriented as arrived to appointment on time with no prompting. Moves both UE equally.  SKIN: No obvious lesions, wounds, erythema, or cyanosis noted on face or hands.  Depression screen [facility] 2/9 04/25/2018 09/20/2017 04/05/2016  Decreased Interest 0 0 0  Down, Depressed, Hopeless 0 0 0  PHQ - 2 Score 0 0 0    Altered sleeping 1 1 -  Tired, decreased energy 1 1 -  Change in appetite 0 3 -  Feeling bad or failure about yourself  0 - -  Trouble concentrating 0 0 -  Moving slowly or fidgety/restless 0 0 -  Suicidal thoughts 0 0 -  PHQ-9 Score 2 5 -  Difficult doing work/chores Not difficult at all - -   Assessment and Plan:   Diagnoses and all orders for this visit:  Hypothyroidism, unspecified type -     levothyroxine (SYNTHROID) 25 MCG tablet; Take 1 tablet (25 mcg total) by mouth daily before breakfast. -     TSH; Future -     T4, free; Future  Hyperlipidemia, unspecified hyperlipidemia type -     rosuvastatin (CRESTOR) 5 MG tablet; Take 1 tablet (5 mg total) by mouth daily. -     Comprehensive metabolic panel; Future -     Lipid panel; Future  Prediabetes Comments: Currently on Metformin. Will consider Metformin 750 ER v [id number] going forward.  Orders: -     metFORMIN (GLUCOPHAGE XR) 750 MG 24 hr tablet; Take 1 tablet (750 mg total) by mouth 2 (two) times daily. -     CBC with Differential/Platelet; Future  Perimenopause -     FLUoxetine (PROZAC) 20 MG capsule; Take 2 capsules (40 mg total) by mouth daily.  Vitamin D deficiency -     Vitamin D, Ergocalciferol, (DRISDOL) 1.25 MG (50000 UT) CAPS capsule; Take 1 capsule (50,000 Units total) by mouth every 7 (seven) days. -     VITAMIN D 25 Hydroxy (Vit-D Deficiency, Fractures); Future    . COVID-19 Education: The signs and symptoms of COVID-19 were discussed with the patient and how to seek care for testing if needed. The importance of social distancing was discussed today. . Reviewed expectations re: course of current medical issues. . Discussed self-management of symptoms. . Outlined signs and symptoms indicating need for more acute intervention. . Patient verbalized understanding and all questions were answered. Marland Kitchen Health Maintenance issues including appropriate healthy diet, exercise, and smoking avoidance were discussed with  patient. . See orders for this visit as documented in the electronic medical record.  Briscoe Deutscher, DO  Records requested if needed. Time spent: 25 minutes, of which >50% was spent in obtaining information about her symptoms, reviewing her previous labs, evaluations, and treatments, counseling her about her condition (please see the discussed topics above), and developing a plan to further investigate it; she had a number of questions which I addressed.

## 2018-11-27 ENCOUNTER — Encounter: Payer: Self-pay | Admitting: Family Medicine

## 2018-11-27 ENCOUNTER — Ambulatory Visit (INDEPENDENT_AMBULATORY_CARE_PROVIDER_SITE_OTHER): Payer: BC Managed Care – PPO | Admitting: Family Medicine

## 2018-11-27 VITALS — Ht 63.0 in | Wt 218.0 lb

## 2018-11-27 DIAGNOSIS — N951 Menopausal and female climacteric states: Secondary | ICD-10-CM | POA: Diagnosis not present

## 2018-11-27 DIAGNOSIS — E785 Hyperlipidemia, unspecified: Secondary | ICD-10-CM | POA: Diagnosis not present

## 2018-11-27 DIAGNOSIS — R7303 Prediabetes: Secondary | ICD-10-CM | POA: Diagnosis not present

## 2018-11-27 DIAGNOSIS — E039 Hypothyroidism, unspecified: Secondary | ICD-10-CM

## 2018-11-27 DIAGNOSIS — E559 Vitamin D deficiency, unspecified: Secondary | ICD-10-CM

## 2018-11-27 MED ORDER — VITAMIN D (ERGOCALCIFEROL) 1.25 MG (50000 UNIT) PO CAPS: 50000.0000 [IU] | ORAL_CAPSULE | ORAL | 0 refills | Status: DC

## 2018-11-27 MED ORDER — FLUOXETINE HCL 20 MG PO CAPS: 40.0000 mg | ORAL_CAPSULE | Freq: Every day | ORAL | 3 refills | Status: DC

## 2018-11-27 MED ORDER — METFORMIN HCL ER 750 MG PO TB24: 750.0000 mg | ORAL_TABLET | Freq: Two times a day (BID) | ORAL | 2 refills | Status: DC

## 2018-11-27 MED ORDER — ROSUVASTATIN CALCIUM 5 MG PO TABS: 5.0000 mg | ORAL_TABLET | Freq: Every day | ORAL | 3 refills | Status: DC

## 2018-11-27 MED ORDER — LEVOTHYROXINE SODIUM 25 MCG PO TABS: 25.0000 ug | ORAL_TABLET | Freq: Every day | ORAL | 3 refills | Status: DC

## 2018-11-28 ENCOUNTER — Other Ambulatory Visit (INDEPENDENT_AMBULATORY_CARE_PROVIDER_SITE_OTHER): Payer: BC Managed Care – PPO

## 2018-11-28 ENCOUNTER — Other Ambulatory Visit: Payer: Self-pay

## 2018-11-28 DIAGNOSIS — R7303 Prediabetes: Secondary | ICD-10-CM | POA: Diagnosis not present

## 2018-11-28 DIAGNOSIS — E039 Hypothyroidism, unspecified: Secondary | ICD-10-CM | POA: Diagnosis not present

## 2018-11-28 DIAGNOSIS — E785 Hyperlipidemia, unspecified: Secondary | ICD-10-CM | POA: Diagnosis not present

## 2018-11-28 DIAGNOSIS — E559 Vitamin D deficiency, unspecified: Secondary | ICD-10-CM | POA: Diagnosis not present

## 2018-11-28 LAB — COMPREHENSIVE METABOLIC PANEL
ALT: 46 U/L — ABNORMAL HIGH (ref 0–35)
AST: 26 U/L (ref 0–37)
Albumin: 4.7 g/dL (ref 3.5–5.2)
Alkaline Phosphatase: 80 U/L (ref 39–117)
BUN: 14 mg/dL (ref 6–23)
CO2: 24 mEq/L (ref 19–32)
Calcium: 9.6 mg/dL (ref 8.4–10.5)
Chloride: 105 mEq/L (ref 96–112)
Creatinine, Ser: 0.73 mg/dL (ref 0.40–1.20)
GFR: 83.11 mL/min (ref >60.00)
Glucose, Bld: 96 mg/dL (ref 70–99)
Potassium: 4.5 mEq/L (ref 3.5–5.1)
Sodium: 138 mEq/L (ref 135–145)
Total Bilirubin: 0.5 mg/dL (ref 0.2–1.2)
Total Protein: 6.9 g/dL (ref 6.0–8.3)

## 2018-11-28 LAB — LIPID PANEL
Cholesterol: 165 mg/dL (ref 0–200)
HDL: 59.3 mg/dL (ref >39.00)
LDL Cholesterol: 92 mg/dL (ref 0–99)
NonHDL: 105.98
Total CHOL/HDL Ratio: 3
Triglycerides: 71 mg/dL (ref 0.0–149.0)
VLDL: 14.2 mg/dL (ref 0.0–40.0)

## 2018-11-28 LAB — CBC WITH DIFFERENTIAL/PLATELET
Basophils Absolute: 0.1 10*3/uL (ref 0.0–0.1)
Basophils Relative: 1.2 % (ref 0.0–3.0)
Eosinophils Absolute: 0.1 10*3/uL (ref 0.0–0.7)
Eosinophils Relative: 1.3 % (ref 0.0–5.0)
HCT: 39.9 % (ref 36.0–46.0)
Hemoglobin: 13.7 g/dL (ref 12.0–15.0)
Lymphocytes Relative: 39.6 % (ref 12.0–46.0)
Lymphs Abs: 1.7 10*3/uL (ref 0.7–4.0)
MCHC: 34.4 g/dL (ref 30.0–36.0)
MCV: 93.5 fl (ref 78.0–100.0)
Monocytes Absolute: 0.3 10*3/uL (ref 0.1–1.0)
Monocytes Relative: 8 % (ref 3.0–12.0)
Neutro Abs: 2.2 10*3/uL (ref 1.4–7.7)
Neutrophils Relative %: 49.9 % (ref 43.0–77.0)
Platelets: 216 10*3/uL (ref 150.0–400.0)
RBC: 4.26 Mil/uL (ref 3.87–5.11)
RDW: 12.3 % (ref 11.5–15.5)
WBC: 4.3 10*3/uL (ref 4.0–10.5)

## 2018-11-28 LAB — VITAMIN D 25 HYDROXY (VIT D DEFICIENCY, FRACTURES): VITD: 33.37 ng/mL (ref 30.00–100.00)

## 2018-11-28 LAB — TSH: TSH: 2.9 u[IU]/mL (ref 0.35–4.50)

## 2018-11-28 LAB — T4, FREE: Free T4: 0.91 ng/dL (ref 0.60–1.60)

## 2018-12-20 DIAGNOSIS — N904 Leukoplakia of vulva: Secondary | ICD-10-CM | POA: Insufficient documentation

## 2019-03-15 ENCOUNTER — Other Ambulatory Visit: Payer: Self-pay

## 2019-03-16 ENCOUNTER — Encounter: Payer: Self-pay | Admitting: Family Medicine

## 2019-03-16 ENCOUNTER — Encounter: Payer: BC Managed Care – PPO | Admitting: Family Medicine

## 2019-03-16 ENCOUNTER — Ambulatory Visit (INDEPENDENT_AMBULATORY_CARE_PROVIDER_SITE_OTHER): Payer: BC Managed Care – PPO | Admitting: Family Medicine

## 2019-03-16 VITALS — BP 118/80 | HR 76 | Temp 96.5°F | Ht 63.0 in | Wt 216.0 lb

## 2019-03-16 DIAGNOSIS — E039 Hypothyroidism, unspecified: Secondary | ICD-10-CM | POA: Diagnosis not present

## 2019-03-16 DIAGNOSIS — E8881 Metabolic syndrome: Secondary | ICD-10-CM | POA: Diagnosis not present

## 2019-03-16 DIAGNOSIS — E038 Other specified hypothyroidism: Secondary | ICD-10-CM

## 2019-03-16 DIAGNOSIS — E782 Mixed hyperlipidemia: Secondary | ICD-10-CM

## 2019-03-16 DIAGNOSIS — Z79890 Hormone replacement therapy (postmenopausal): Secondary | ICD-10-CM

## 2019-03-16 DIAGNOSIS — R945 Abnormal results of liver function studies: Secondary | ICD-10-CM | POA: Diagnosis not present

## 2019-03-16 DIAGNOSIS — R7989 Other specified abnormal findings of blood chemistry: Secondary | ICD-10-CM

## 2019-03-16 DIAGNOSIS — M1990 Unspecified osteoarthritis, unspecified site: Secondary | ICD-10-CM

## 2019-03-16 DIAGNOSIS — F411 Generalized anxiety disorder: Secondary | ICD-10-CM

## 2019-03-16 HISTORY — DX: Hormone replacement therapy: Z79.890

## 2019-03-16 LAB — HEMOGLOBIN A1C: Hgb A1c MFr Bld: 5.9 % (ref 4.6–6.5)

## 2019-03-16 LAB — VITAMIN D 25 HYDROXY (VIT D DEFICIENCY, FRACTURES): VITD: 44.4 ng/mL (ref 30.00–100.00)

## 2019-03-16 NOTE — Patient Instructions (Addendum)
Please return in 3 months for follow up on your sugars.   We will stop the metformin and see how you do! Work on eating clean and walking daily.  I'll let you know your results via mychart. We can consider lowering your prozac dose as discussed.   It was a pleasure meeting you today! Thank you for choosing [place] to meet your healthcare needs! I truly look forward to working with you. If you have any questions or concerns, please send me a message via Mychart or call the office at [phone].

## 2019-03-16 NOTE — Progress Notes (Signed)
Subjective  CC:  Chief Complaint  Patient presents with  . Transitions Of Care    TOC form Dr. Juleen China  . Hypothyroidism    Patient takes medication daily. No side effects. No problems with her thyroid.  Marland Kitchen Hyperlipidemia    Started exercing, about 4-5 days a week.Patient's diet is not good.    HPI: Terri Jensen is a 54 y.o. female who presents to [facility] [place] today to establish care with me as a new patient.  Reviewed chart including recent PCP labs, rheumatology notes.  Lab results over the last 3 years. She has the following concerns or needs:  Impaired fasting glucose: Reviewed chart.  Highest A1c 6.0.  Now on Metformin 750 mg daily.  Not the extended release.  Tolerates well.  Would like to work harder on diet and lose some weight and increase her exercise.  Strong family history of diabetes.  No symptoms of hypoglycemia.  Probable subclinical hypothyroidism on low-dose supplementation.  Education given.  Tolerating well.  No symptoms of low thyroid.  Most recent check in October. Lab Results  Component Value Date   TSH 2.90 11/28/2018    Hyperlipidemia on low-dose Crestor.  She had attended lipid allergy clinic in the past.  She qualifies for statin.  Tolerates fairly well although she does have arthralgias with possible diagnosis of psoriatic arthritis.  It is manageable.  Most recently LDL was just below 100.  She does have mildly elevated LFT.  This has been stable.  CT scan was unrevealing except for a small hepatic cyst.  No diagnosis of fatty liver.  She reports negative hepatitis screening per rheumatology in the near past.  Obesity: Would like to lose weight.  Drinks 4 diet Cokes daily.  Would like to change that habit.  Eats overall healthy but enjoys larger quantities.  Rare exercise.  Continues to work full-time.  Vitamin D deficiency: Has been on weekly supplementation and due for recheck.  Noted that it has improved her leg  pain.  Chronic generalized anxiety disorder: Has been on Prozac for the last 20 years or so.  Works well.  Anxiety is well controlled.  No symptoms of depression.  Is on high-dose.  Postmenopausal HRT managed by GYN.  Current screens are up-to-date  Possible inflammatory arthritis.  Symptoms are manageable.  She is not currently on treatment.  Has been evaluated by rheumatology.  Assessment  1. Subclinical hypothyroidism   2. LFTs abnormal   3. Insulin resistance, Rx Metformin   4. Mixed hyperlipidemia   5. Low vitamin D level   6. GAD (generalized anxiety disorder)   7. Postmenopausal HRT (hormone replacement therapy)   8. Inflammatory arthritis      Plan   Impaired fasting glucose: We will stop Metformin and improve diet and exercise.  Work on weight loss.  Will recheck A1c in 3 months.  Education, risk and benefits and treatment options discussed in detail.  Very mild elevated LFTs.  Will monitor.  She is on low-dose statin.  Subclinical hypothyroidism on low-dose supplementation.  Continue for now.  Recheck vitamin D deficiency.  General anxiety disorder is well controlled.  Will try decreasing to 20 mg daily of Prozac over a slow wean to see if she will tolerate that.  HRT is stable.  Inflammatory arthritis: Currently not active.  As needed's NSAIDs.  Will follow up with rheumatology if worsens.  Follow up:  Return in about 3 months (around 06/14/2019). Orders  Placed This Encounter  Procedures  . VITAMIN D 25 Hydroxy (Vit-D Deficiency, Fractures)  . Hemoglobin A1c   No orders of the defined types were placed in this encounter.    Depression screen [facility] 2/9 04/25/2018 09/20/2017 04/05/2016 04/05/2016  Decreased Interest 0 0 0 0  Down, Depressed, Hopeless 0 0 0 0  PHQ - 2 Score 0 0 0 0  Altered sleeping 1 1 - -  Tired, decreased energy 1 1 - -  Change in appetite 0 3 - -  Feeling bad or failure about yourself  0 - - -  Trouble concentrating 0 0 - -  Moving slowly or  fidgety/restless 0 0 - -  Suicidal thoughts 0 0 - -  PHQ-9 Score 2 5 - -  Difficult doing work/chores Not difficult at all - - -    We updated and reviewed the patient's past history in detail and it is documented below.  Patient Active Problem List   Diagnosis Date Noted  . Postmenopausal HRT (hormone replacement therapy) 03/16/2019  . GAD (generalized anxiety disorder) 03/16/2019  . Lichen sclerosus of female genitalia 12/20/2018  . Prolapsed lumbar disc 03/02/2018  . Lumbar radiculopathy 02/21/2018  . LFTs abnormal 01/03/2018  . Inflammatory arthritis 01/03/2018  . Acquired hypothyroidism 08/05/2016  . Low vitamin D level 04/05/2016  . Insulin resistance, Rx Metformin 04/05/2016  . Mixed hyperlipidemia 04/05/2016  . Kidney stones    Health Maintenance  Topic Date Due  . PAP SMEAR-Modifier  10/21/2018  . HIV Screening  04/19/2023 (Originally 01/02/1980)  . MAMMOGRAM  11/21/2019  . COLONOSCOPY  02/17/2026  . TETANUS/TDAP  08/05/2026  . INFLUENZA VACCINE  Completed   Immunization History  Administered Date(s) Administered  . Influenza,inj,Quad PF,6+ Mos 11/09/2016  . Influenza,inj,quad, With Preservative 10/16/2016  . Influenza-Unspecified 02/15/2013, 11/15/2016, 11/15/2017  . Tdap 08/04/2016   Current Meds  Medication Sig  . EPINEPHrine 0.3 mg/0.3 mL IJ SOAJ injection epinephrine 0.3 mg/0.3 mL injection, auto-injector  . Estradiol (IMVEXXY MAINTENANCE PACK) 4 MCG INST Imvexxy Maintenance Pack 4 mcg vaginal insert  . FLUoxetine (PROZAC) 20 MG capsule Take 2 capsules (40 mg total) by mouth daily.  Marland Kitchen levothyroxine (SYNTHROID) 25 MCG tablet Take 1 tablet (25 mcg total) by mouth daily before breakfast.  . metFORMIN (GLUCOPHAGE XR) 750 MG 24 hr tablet Take 1 tablet (750 mg total) by mouth 2 (two) times daily.  . Multiple Vitamins-Minerals (MULTIVITAMIN ADULT) CHEW Chew 2 each by mouth daily.  . rosuvastatin (CRESTOR) 5 MG tablet Take 1 tablet (5 mg total) by mouth daily.  Marland Kitchen  tretinoin (RETIN-A) 0.1 % cream APPLY TO AFFECTED AREA EVERY DAY  . vitamin B-12 (CYANOCOBALAMIN) 1000 MCG tablet Take 1,000 mcg by mouth daily.  . Vitamin D, Cholecalciferol, 50 MCG (2000 UT) CAPS Take by mouth.    Allergies: Patient is allergic to shellfish allergy; tape; codeine; and sulfa antibiotics. Past Medical History Patient  has a past medical history of Anxiety, History of kidney stones, History of shingles, History of varicella, HLD (hyperlipidemia), Hypothyroidism, Low vitamin D level, Perimenopause, PONV (postoperative nausea and vomiting), Postmenopausal HRT (hormone replacement therapy) (03/16/2019), and Prediabetes. Past Surgical History Patient  has a past surgical history that includes Novasure ablation (2005); Cholecystectomy; Wisdom tooth extraction; Cesarean section; Radioactive seed guided excisional breast biopsy (Left, 05/04/2017); Breast excisional biopsy (Left, 2019); and Augmentation mammaplasty (Bilateral). Family History: Patient family history includes Breast cancer in her mother; COPD in her father and mother; Cancer (age of onset: 83) in  her paternal grandmother; Colon cancer in her paternal grandmother; Diabetes in her father and mother; Heart disease in her father and mother; Hypertension in her brother, father, and mother; Kidney disease in her father; Liver disease in her father; Stroke in her mother; Thyroid disease in her mother. Social History:  Patient  reports that she quit smoking about 28 years ago. Her smoking use included cigarettes. She has a 7.00 pack-year smoking history. She has never used smokeless tobacco. She reports current alcohol use of about 6.0 - 8.0 standard drinks of alcohol per week. She reports that she does not use drugs.  Review of Systems: Constitutional: negative for fever or malaise Ophthalmic: negative for photophobia, double vision or loss of vision Cardiovascular: negative for chest pain, dyspnea on exertion, or new LE  swelling Respiratory: negative for SOB or persistent cough Gastrointestinal: negative for abdominal pain, change in bowel habits or melena Genitourinary: negative for dysuria or gross hematuria Musculoskeletal: negative for new gait disturbance or muscular weakness Integumentary: negative for new or persistent rashes Neurological: negative for TIA or stroke symptoms Psychiatric: negative for SI or delusions Allergic/Immunologic: negative for hives  Patient Care Team    Relationship Specialty Notifications Start End  Leamon Arnt, MD PCP - General Family Medicine  03/16/19   Juanita Craver, MD Consulting Physician Gastroenterology  01/03/18   Devra Dopp, MD Referring Physician Dermatology  01/03/18   Loleta Books, MD Consulting Physician Ophthalmology  01/03/18   Delsa Bern, MD Consulting Physician Obstetrics and Gynecology  01/03/18   Lahoma Rocker, MD Consulting Physician Rheumatology  08/06/18     Objective  Vitals: BP 118/80 (BP Location: Right Arm, Patient Position: Sitting, Cuff Size: Large)   Pulse 76   Temp (!) 96.5 F (35.8 C) (Temporal)   Ht 5\' 3"  (1.6 m)   Wt 216 lb (98 kg)   LMP 09/08/2013   SpO2 94%   BMI 38.26 kg/m  General:  Well developed, well nourished, no acute distress  Psych:  Alert and oriented,normal mood and affect HEENT:  Normocephalic, atraumatic, non-icteric sclera, PERRL,  Cardiovascular:  RRR without gallop, rub or murmur, no edema Respiratory:  Good breath sounds bilaterally, CTAB with normal respiratory effort Neuro: No tremor   Skin:  Warm, no rashes or suspicious lesions noted Neurologic:    Mental status is normal. Gross motor and sensory exams are normal. Normal gait   Commons side effects, risks, benefits, and alternatives for medications and treatment plan prescribed today were discussed, and the patient expressed understanding of the given instructions. Patient is instructed to call or message via MyChart if  he/she has any questions or concerns regarding our treatment plan. No barriers to understanding were identified. We discussed Red Flag symptoms and signs in detail. Patient expressed understanding regarding what to do in case of urgent or emergency type symptoms.   Medication list was reconciled, printed and provided to the patient in AVS. Patient instructions and summary information was reviewed with the patient as documented in the AVS. This note was prepared with assistance of Dragon voice recognition software. Occasional wrong-word or sound-a-like substitutions may have occurred due to the inherent limitations of voice recognition software  This visit occurred during the SARS-CoV-2 public health emergency.  Safety protocols were in place, including screening questions prior to the visit, additional usage of staff PPE, and extensive cleaning of exam room while observing appropriate contact time as indicated for disinfecting solutions.

## 2019-03-19 NOTE — Progress Notes (Signed)
This encounter was created in error - please disregard.

## 2019-03-22 ENCOUNTER — Encounter: Payer: Self-pay | Admitting: Family Medicine

## 2019-04-14 ENCOUNTER — Ambulatory Visit: Payer: BC Managed Care – PPO | Attending: Internal Medicine

## 2019-04-14 DIAGNOSIS — Z23 Encounter for immunization: Secondary | ICD-10-CM | POA: Insufficient documentation

## 2019-04-14 NOTE — Progress Notes (Signed)
   Covid-19 Vaccination Clinic  Name:  Terri Jensen    MRN: [id number] DOB: [birth year]  04/14/2019  [name] was observed post Covid-19 immunization for 15 minutes without incidence. She was provided with Vaccine Information Sheet and instruction to access the V-Safe system.   [name] was instructed to call 911 with any severe reactions post vaccine: Marland Kitchen Difficulty breathing  . Swelling of your face and throat  . A fast heartbeat  . A bad rash all over your body  . Dizziness and weakness    Immunizations Administered    Name Date Dose VIS Date Route   Pfizer COVID-19 Vaccine 04/14/2019  1:22 PM 0.3 mL 01/26/2019 Intramuscular   Manufacturer: [place]   Lot: [id number]   [place]: [id number]

## 2019-05-05 ENCOUNTER — Ambulatory Visit: Payer: BC Managed Care – PPO | Attending: Internal Medicine

## 2019-05-05 DIAGNOSIS — Z23 Encounter for immunization: Secondary | ICD-10-CM

## 2019-05-05 NOTE — Progress Notes (Signed)
   Covid-19 Vaccination Clinic  Name:  Zahrah Cerbone    MRN: [id number] DOB: 1965/08/23  05/05/2019  Ms. Jasso was observed post Covid-19 immunization for 30 minutes based on pre-vaccination screening without incident. She was provided with Vaccine Information Sheet and instruction to access the V-Safe system.   Ms. Adair was instructed to call 911 with any severe reactions post vaccine: Marland Kitchen Difficulty breathing  . Swelling of face and throat  . A fast heartbeat  . A bad rash all over body  . Dizziness and weakness   Immunizations Administered    Name Date Dose VIS Date Route   Pfizer COVID-19 Vaccine 05/05/2019 12:02 PM 0.3 mL 01/26/2019 Intramuscular   Manufacturer: [place]   Lot: [id number]   [place]: [id number]

## 2019-06-12 ENCOUNTER — Ambulatory Visit: Payer: BC Managed Care – PPO | Admitting: Family Medicine

## 2019-06-18 ENCOUNTER — Ambulatory Visit (INDEPENDENT_AMBULATORY_CARE_PROVIDER_SITE_OTHER): Payer: BC Managed Care – PPO | Admitting: Family Medicine

## 2019-06-18 ENCOUNTER — Other Ambulatory Visit: Payer: Self-pay

## 2019-06-18 ENCOUNTER — Encounter: Payer: Self-pay | Admitting: Family Medicine

## 2019-06-18 VITALS — BP 98/60 | HR 82 | Temp 98.1°F | Ht 63.0 in | Wt 215.0 lb

## 2019-06-18 DIAGNOSIS — E669 Obesity, unspecified: Secondary | ICD-10-CM | POA: Diagnosis not present

## 2019-06-18 DIAGNOSIS — E88819 Insulin resistance, unspecified: Secondary | ICD-10-CM

## 2019-06-18 DIAGNOSIS — E038 Other specified hypothyroidism: Secondary | ICD-10-CM

## 2019-06-18 DIAGNOSIS — E8881 Metabolic syndrome: Secondary | ICD-10-CM

## 2019-06-18 DIAGNOSIS — E039 Hypothyroidism, unspecified: Secondary | ICD-10-CM | POA: Diagnosis not present

## 2019-06-18 DIAGNOSIS — F411 Generalized anxiety disorder: Secondary | ICD-10-CM | POA: Diagnosis not present

## 2019-06-18 LAB — POCT GLYCOSYLATED HEMOGLOBIN (HGB A1C): Hemoglobin A1C: 5.6 % (ref 4.0–5.6)

## 2019-06-18 NOTE — Progress Notes (Signed)
Subjective  CC:  Chief Complaint  Patient presents with  . Anxiety  . Discuss Medication    HPI: Terri Jensen is a 55 y.o. female who presents to the office today to address the problems listed above in the chief complaint.  IR follow up: stopped metformin in jan. Feels well. No sxs of hyperglycemia. weight and diet are stable. Hasn't noted any changes off the metformin.   GAD w/ h/o OCD tendencies on high dose prozac for many many years. Is interested to seeing if she still needs it. Mood is good. No concerns with worry or anxiety or depression now.   Subclinical hypothyroidism: on 69mcg daily of supplement. Would be interested in finding out if she really needs the supplement now. Doesn't feel like med has helped nor hindered anything.   Obesity: working on diet.   Lab Results  Component Value Date   TSH 2.90 11/28/2018     Immunization History  Administered Date(s) Administered  . Influenza,inj,Quad PF,6+ Mos 11/09/2016  . Influenza,inj,quad, With Preservative 10/16/2016  . Influenza-Unspecified 02/15/2013, 11/15/2016, 11/15/2017  . PFIZER SARS-COV-2 Vaccination 04/14/2019, 05/05/2019  . Tdap 08/04/2016    Diabetes Related Lab Review: Lab Results  Component Value Date   HGBA1C 5.6 06/18/2019   HGBA1C 5.9 03/16/2019   HGBA1C 5.8 04/26/2018    No results found for: Derl Barrow Lab Results  Component Value Date   CREATININE 0.73 11/28/2018   BUN 14 11/28/2018   NA 138 11/28/2018   K 4.5 11/28/2018   CL 105 11/28/2018   CO2 24 11/28/2018   Lab Results  Component Value Date   CHOL 165 11/28/2018   CHOL 267 (H) 04/26/2018   CHOL 173 09/19/2017   Lab Results  Component Value Date   HDL 59.30 11/28/2018   HDL 60.50 04/26/2018   HDL 57.00 09/19/2017   Lab Results  Component Value Date   LDLCALC 92 11/28/2018   LDLCALC 179 (H) 04/26/2018   LDLCALC 93 09/19/2017   Lab Results  Component Value Date   TRIG 71.0 11/28/2018   TRIG 137.0  04/26/2018   TRIG 118.0 09/19/2017   Lab Results  Component Value Date   CHOLHDL 3 11/28/2018   CHOLHDL 4 04/26/2018   CHOLHDL 3 09/19/2017   No results found for: LDLDIRECT The 10-year ASCVD risk score Mikey Bussing DC Jr., et al., 2013) is: 0.8%   Values used to calculate the score:     Age: 35 years     Sex: Female     Is Non-Hispanic African American: No     Diabetic: No     Tobacco smoker: No     Systolic Blood Pressure: 98 mmHg     Is BP treated: No     HDL Cholesterol: 59.3 mg/dL     Total Cholesterol: 165 mg/dL  BP Readings from Last 3 Encounters:  06/18/19 98/60  03/16/19 118/80  04/25/18 102/82   Wt Readings from Last 3 Encounters:  06/18/19 215 lb (97.5 kg)  03/16/19 216 lb (98 kg)  11/27/18 218 lb (98.9 kg)    Health Maintenance  Topic Date Due  . HIV Screening  04/19/2023 (Originally 01/02/1980)  . INFLUENZA VACCINE  09/16/2019  . MAMMOGRAM  11/21/2019  . PAP SMEAR-Modifier  12/19/2021  . COLONOSCOPY  02/17/2026  . TETANUS/TDAP  08/05/2026  . COVID-19 Vaccine  Completed    Assessment  1. Insulin resistance   2. GAD (generalized anxiety disorder)   3. Subclinical hypothyroidism   4. Obesity (  BMI 35.0-39.9 without comorbidity)      Plan   IR:  Resolved. Off metformin. Continue with healthy diet and working on weight loss.  GAD: discussed risks/benefits of adjusting meds. She will try weaning down to 20mg  over the next month and monitor her mood/anxiety.   Subclinical hypothyroidism. Will stop levothyroxine 8 weeks prior to next cpe in October for recheck levels. Can then adjust or restart meds IF indicated. If subclinical, may just monitor. Pt understands and agress.   Obesity: discussed recommended weight loss.   Follow up: Return in about 5 months (around 11/18/2019) for complete physical.  Visit date not found  Orders Placed This Encounter  Procedures  . POCT glycosylated hemoglobin (Hb A1C)   No orders of the defined types were placed in this  encounter.     I reviewed the patients updated PMH, FH, and SocHx.    Patient Active Problem List   Diagnosis Date Noted  . GAD (generalized anxiety disorder) 03/16/2019    Priority: High  . Subclinical hypothyroidism 08/05/2016    Priority: High  . Mixed hyperlipidemia 04/05/2016    Priority: High  . Postmenopausal HRT (hormone replacement therapy) 03/16/2019    Priority: Medium  . Lichen sclerosus of female genitalia 12/20/2018    Priority: Medium  . Lumbar radiculopathy 02/21/2018    Priority: Medium  . LFTs abnormal 01/03/2018    Priority: Medium  . Inflammatory arthritis 01/03/2018    Priority: Medium  . Kidney stones     Priority: Medium  . Low vitamin D level 04/05/2016    Priority: Low  . Obesity (BMI 35.0-39.9 without comorbidity) 04/05/2016   Current Meds  Medication Sig  . EPINEPHrine 0.3 mg/0.3 mL IJ SOAJ injection epinephrine 0.3 mg/0.3 mL injection, auto-injector  . Estradiol (IMVEXXY MAINTENANCE PACK) 4 MCG INST Imvexxy Maintenance Pack 4 mcg vaginal insert  . FLUoxetine (PROZAC) 20 MG capsule Take 2 capsules (40 mg total) by mouth daily.  Marland Kitchen levothyroxine (SYNTHROID) 25 MCG tablet Take 1 tablet (25 mcg total) by mouth daily before breakfast.  . Multiple Vitamins-Minerals (MULTIVITAMIN ADULT) CHEW Chew 2 each by mouth daily.  . rosuvastatin (CRESTOR) 5 MG tablet Take 1 tablet (5 mg total) by mouth daily.  Marland Kitchen tretinoin (RETIN-A) 0.1 % cream APPLY TO AFFECTED AREA EVERY DAY  . vitamin B-12 (CYANOCOBALAMIN) 1000 MCG tablet Take 1,000 mcg by mouth daily.  . Vitamin D, Cholecalciferol, 50 MCG (2000 UT) CAPS Take by mouth.    Allergies: Patient is allergic to shellfish allergy; tape; codeine; and sulfa antibiotics. Family History: Patient family history includes Breast cancer in her mother; COPD in her father and mother; Cancer (age of onset: 86) in her paternal grandmother; Colon cancer in her paternal grandmother; Diabetes in her father and mother; Heart disease  in her father and mother; Hypertension in her brother, father, and mother; Kidney disease in her father; Liver disease in her father; Stroke in her mother; Thyroid disease in her mother. Social History:  Patient  reports that she quit smoking about 29 years ago. Her smoking use included cigarettes. She has a 7.00 pack-year smoking history. She has never used smokeless tobacco. She reports current alcohol use of about 6.0 - 8.0 standard drinks of alcohol per week. She reports that she does not use drugs.  Review of Systems: Constitutional: Negative for fever malaise or anorexia Cardiovascular: negative for chest pain Respiratory: negative for SOB or persistent cough Gastrointestinal: negative for abdominal pain  Objective  Vitals: BP 98/60  Pulse 82   Temp 98.1 F (36.7 C) (Temporal)   Ht 5\' 3"  (1.6 m)   Wt 215 lb (97.5 kg)   LMP 09/08/2013   SpO2 95%   BMI 38.09 kg/m  General: no acute distress , A&Ox3 Skin:  Warm, no rashes Neuro: no tremor     Commons side effects, risks, benefits, and alternatives for medications and treatment plan prescribed today were discussed, and the patient expressed understanding of the given instructions. Patient is instructed to call or message via MyChart if he/she has any questions or concerns regarding our treatment plan. No barriers to understanding were identified. We discussed Red Flag symptoms and signs in detail. Patient expressed understanding regarding what to do in case of urgent or emergency type symptoms.   Medication list was reconciled, printed and provided to the patient in AVS. Patient instructions and summary information was reviewed with the patient as documented in the AVS. This note was prepared with assistance of Dragon voice recognition software. Occasional wrong-word or sound-a-like substitutions may have occurred due to the inherent limitations of voice recognition software  This visit occurred during the SARS-CoV-2 public health  emergency.  Safety protocols were in place, including screening questions prior to the visit, additional usage of staff PPE, and extensive cleaning of exam room while observing appropriate contact time as indicated for disinfecting solutions.

## 2019-06-18 NOTE — Patient Instructions (Signed)
Please return in October 2021 for your annual complete physical; please come fasting.  Your sugar test is normal!  Wean down on your prozac over the next 3-4 weeks to 20mg  daily and give it some time to see how you feel on the lower dose. IF you have problems: increase in anxiety or worry or OCD thoughts, you may return to the higher dose.   I will recheck your thyroid in October.   If you have any questions or concerns, please don't hesitate to send me a message via MyChart or call the office at [phone]. Thank you for visiting with Terri Jensen today! It's our pleasure caring for you.

## 2019-10-23 ENCOUNTER — Other Ambulatory Visit: Payer: Self-pay | Admitting: Obstetrics and Gynecology

## 2019-10-23 DIAGNOSIS — Z1231 Encounter for screening mammogram for malignant neoplasm of breast: Secondary | ICD-10-CM

## 2019-11-22 ENCOUNTER — Ambulatory Visit
Admission: RE | Admit: 2019-11-22 | Discharge: 2019-11-22 | Disposition: A | Discharge status: Home / Self Care | Payer: BC Managed Care – PPO | Source: Ambulatory Visit | Attending: Obstetrics and Gynecology | Admitting: Obstetrics and Gynecology

## 2019-11-22 ENCOUNTER — Other Ambulatory Visit: Payer: Self-pay

## 2019-11-22 DIAGNOSIS — Z1231 Encounter for screening mammogram for malignant neoplasm of breast: Secondary | ICD-10-CM

## 2019-12-08 ENCOUNTER — Other Ambulatory Visit: Payer: Self-pay | Admitting: Family Medicine

## 2019-12-08 DIAGNOSIS — E785 Hyperlipidemia, unspecified: Secondary | ICD-10-CM

## 2019-12-09 ENCOUNTER — Other Ambulatory Visit: Payer: Self-pay | Admitting: Family Medicine

## 2019-12-09 DIAGNOSIS — N951 Menopausal and female climacteric states: Secondary | ICD-10-CM

## 2019-12-10 ENCOUNTER — Encounter: Payer: BC Managed Care – PPO | Admitting: Family Medicine

## 2019-12-10 ENCOUNTER — Telehealth: Payer: Self-pay

## 2019-12-10 NOTE — Telephone Encounter (Signed)
  LAST APPOINTMENT DATE: 06/18/2019  NEXT APPOINTMENT DATE:@2 /12/2020 -cpe   MEDICATION: rosuvastatin (CRESTOR) 5 MG tablet // FLUoxetine (PROZAC) 20 MG capsule  PHARMACY: [id number] - [place], [place] - [address]  Comments: [facility] needing [facility]Andy as the authorizing doctor   Please advise

## 2019-12-11 ENCOUNTER — Other Ambulatory Visit: Payer: Self-pay

## 2019-12-11 DIAGNOSIS — E785 Hyperlipidemia, unspecified: Secondary | ICD-10-CM

## 2019-12-11 DIAGNOSIS — N951 Menopausal and female climacteric states: Secondary | ICD-10-CM

## 2019-12-11 MED ORDER — ROSUVASTATIN CALCIUM 5 MG PO TABS: 5.0000 mg | ORAL_TABLET | Freq: Every day | ORAL | 3 refills | Status: DC

## 2019-12-11 MED ORDER — FLUOXETINE HCL 20 MG PO CAPS: 40.0000 mg | ORAL_CAPSULE | Freq: Every day | ORAL | 3 refills | Status: DC

## 2019-12-11 NOTE — Telephone Encounter (Signed)
Refill request sent to pharmacy.

## 2020-03-28 ENCOUNTER — Encounter: Payer: Self-pay | Admitting: Family Medicine

## 2020-03-28 ENCOUNTER — Other Ambulatory Visit: Payer: Self-pay

## 2020-03-28 ENCOUNTER — Ambulatory Visit (INDEPENDENT_AMBULATORY_CARE_PROVIDER_SITE_OTHER): Payer: Commercial Managed Care - PPO | Admitting: Family Medicine

## 2020-03-28 VITALS — BP 131/80 | HR 83 | Temp 98.0°F | Ht 63.0 in | Wt 211.4 lb

## 2020-03-28 DIAGNOSIS — Z0000 Encounter for general adult medical examination without abnormal findings: Secondary | ICD-10-CM | POA: Diagnosis not present

## 2020-03-28 DIAGNOSIS — F411 Generalized anxiety disorder: Secondary | ICD-10-CM

## 2020-03-28 DIAGNOSIS — N943 Premenstrual tension syndrome: Secondary | ICD-10-CM | POA: Insufficient documentation

## 2020-03-28 DIAGNOSIS — E038 Other specified hypothyroidism: Secondary | ICD-10-CM | POA: Diagnosis not present

## 2020-03-28 DIAGNOSIS — E669 Obesity, unspecified: Secondary | ICD-10-CM

## 2020-03-28 DIAGNOSIS — E782 Mixed hyperlipidemia: Secondary | ICD-10-CM | POA: Diagnosis not present

## 2020-03-28 DIAGNOSIS — Z79890 Hormone replacement therapy (postmenopausal): Secondary | ICD-10-CM

## 2020-03-28 DIAGNOSIS — R7989 Other specified abnormal findings of blood chemistry: Secondary | ICD-10-CM | POA: Insufficient documentation

## 2020-03-28 NOTE — Progress Notes (Signed)
Subjective  Chief Complaint  Patient presents with  . Annual Exam    Not fasting     HPI: Terri Jensen is a 55 y.o. female who presents to [facility] [place] today for a Female Wellness Visit. She also has the concerns and/or needs as listed above in the chief complaint. These will be addressed in addition to the Health Maintenance Visit.   Wellness Visit: annual visit with health maintenance review and exam without Pap   HM: Screens are up-to-date.  Sees GYN.  Had female loss exam in November.  Eye exam is up-to-date.  Overall feeling well.  Working on weight loss.  No new concerns.  Eligible for Shingrix Chronic disease f/u and/or acute problem visit: (deemed necessary to be done in addition to the wellness visit):  Mixed hyperlipidemia with family history of premature heart disease: On statin and tolerates well.  Not fasting today for recheck.  Subclinical hypothyroidism: Had been on 25 mcg of Synthroid daily.  Stopped this about 6 months ago.  Feels fine.  No changes in skin, weight or energy level.  General anxiety disorder on high-dose Prozac with OCD tendencies.  Stable.  No adverse effects.  Postmenopausal HRT per GYN.  Manages hot flashes and menopausal symptoms.  Elevated LFTs over the last several years.  Patient denies right upper quadrant pain, jaundice.  No diagnosis.  Did review MRI from 2016 showing benign hepatic cyst.  Occasional alcohol.  Rare Tylenol.  Assessment  1. Annual physical exam   2. Mixed hyperlipidemia   3. Obesity (BMI 35.0-39.9 without comorbidity)   4. Subclinical hypothyroidism   5. GAD (generalized anxiety disorder)   6. Postmenopausal HRT (hormone replacement therapy)   7. Elevated LFTs      Plan  Female Wellness Visit:  Age appropriate Health Maintenance and Prevention measures were discussed with patient. Included topics are cancer screening recommendations, ways to keep healthy (see AVS) including dietary  and exercise recommendations, regular eye and dental care, use of seat belts, and avoidance of moderate alcohol use and tobacco use.  Screens are up-to-date  BMI: discussed patient's BMI and encouraged positive lifestyle modifications to help get to or maintain a target BMI.  HM needs and immunizations were addressed and ordered. See below for orders. See HM and immunization section for updates.  Patient to return for Shingrix if covered by insurance  Routine labs and screening tests ordered including cmp, cbc and lipids where appropriate.  Discussed recommendations regarding Vit D and calcium supplementation (see AVS)  Chronic disease management visit and/or acute problem visit:  Mixed hyperlipidemia: Recheck lipids.  Tolerates low-dose high intensity statin.  Crestor 5 mg daily.  Subclinical hypothyroidism now off supplements.  Recheck today.  Asymptomatic.  GAD and OCD: Stable on high-dose Prozac, continue Prozac 40 mg daily.  HRT per GYN.  Start evaluation for elevated LFTs.  Very mild.  Recheck today and check ultrasound and hepatitis screens.  Could be related to fatty infiltration   Follow up: 12 months for complete physical Orders Placed This Encounter  Procedures  . US ABDOMEN LIMITED RUQ (LIVER/GB)  . CBC with Differential/Platelet  . Comprehensive metabolic panel  . Lipid panel  . TSH  . T4, free  . T3  . Hemoglobin A1c  . Hepatitis C antibody  . Hepatitis B surface antigen  . Hepatitis B surface antibody,qualitative   No orders of the defined types were placed in this encounter.     Body  mass index is 37.45 kg/m. Wt Readings from Last 3 Encounters:  03/28/20 211 lb 6.4 oz (95.9 kg)  06/18/19 215 lb (97.5 kg)  03/16/19 216 lb (98 kg)     Patient Active Problem List   Diagnosis Date Noted  . GAD (generalized anxiety disorder) 03/16/2019    Priority: High  . Subclinical hypothyroidism 08/05/2016    Priority: High  . Obesity (BMI 35.0-39.9 without  comorbidity) 04/05/2016    Priority: High  . Mixed hyperlipidemia 04/05/2016    Priority: High  . Elevated LFTs 03/28/2020    Priority: Medium  . Postmenopausal HRT (hormone replacement therapy) 03/16/2019    Priority: Medium  . Lichen sclerosus of female genitalia 12/20/2018    Priority: Medium  . Lumbar radiculopathy 02/21/2018    Priority: Medium  . Inflammatory arthritis 01/03/2018    Priority: Medium  . Kidney stones     Priority: Medium  . Low vitamin D level 04/05/2016    Priority: Low   Health Maintenance  Topic Date Due  . Hepatitis C Screening  Never done  . HIV Screening  04/19/2023 (Originally 01/02/1981)  . MAMMOGRAM  11/21/2020  . PAP SMEAR-Modifier  12/20/2023  . COLONOSCOPY (Pts 45-49yrs Insurance coverage will need to be confirmed)  02/17/2026  . TETANUS/TDAP  08/05/2026  . INFLUENZA VACCINE  Completed  . COVID-19 Vaccine  Completed   Immunization History  Administered Date(s) Administered  . Influenza,inj,Quad PF,6+ Mos 11/09/2016  . Influenza,inj,quad, With Preservative 10/16/2016  . Influenza-Unspecified 02/15/2013, 11/15/2016, 11/15/2017, 11/16/2019  . PFIZER(Purple Top)SARS-COV-2 Vaccination 04/14/2019, 05/05/2019, 01/24/2020  . Tdap 08/04/2016  . Unspecified SARS-COV-2 Vaccination 04/16/2019, 05/17/2019   We updated and reviewed the patient's past history in detail and it is documented below. Allergies: Patient is allergic to shellfish allergy, tape, codeine, and sulfa antibiotics. Past Medical History Patient  has a past medical history of Anxiety, History of kidney stones, History of shingles, History of varicella, HLD (hyperlipidemia), Hypothyroidism, Low vitamin D level, Perimenopause, PONV (postoperative nausea and vomiting), Postmenopausal HRT (hormone replacement therapy) (03/16/2019), and Prediabetes. Past Surgical History Patient  has a past surgical history that includes Novasure ablation (2005); Cholecystectomy; Wisdom tooth extraction;  Cesarean section; Radioactive seed guided excisional breast biopsy (Left, 05/04/2017); Augmentation mammaplasty (Bilateral); and Breast excisional biopsy (Left, 2019). Family History: Patient family history includes Breast cancer in her mother; COPD in her father and mother; Cancer (age of onset: 39) in her paternal grandmother; Colon cancer in her paternal grandmother; Diabetes in her father and mother; Heart disease in her father and mother; Hypertension in her brother, father, and mother; Kidney disease in her father; Liver disease in her father; Stroke in her mother; Thyroid disease in her mother. Social History:  Patient  reports that she quit smoking about 29 years ago. Her smoking use included cigarettes. She has a 7.00 pack-year smoking history. She has never used smokeless tobacco. She reports current alcohol use of about 6.0 - 8.0 standard drinks of alcohol per week. She reports that she does not use drugs.  Review of Systems: Constitutional: negative for fever or malaise Ophthalmic: negative for photophobia, double vision or loss of vision Cardiovascular: negative for chest pain, dyspnea on exertion, or new LE swelling Respiratory: negative for SOB or persistent cough Gastrointestinal: negative for abdominal pain, change in bowel habits or melena Genitourinary: negative for dysuria or gross hematuria, no abnormal uterine bleeding or disharge Musculoskeletal: negative for new gait disturbance or muscular weakness Integumentary: negative for new or persistent rashes, no breast lumps  Neurological: negative for TIA or stroke symptoms Psychiatric: negative for SI or delusions Allergic/Immunologic: negative for hives  Patient Care Team    Relationship Specialty Notifications Start End  Leamon Arnt, MD PCP - General Family Medicine  03/16/19   Juanita Craver, MD Consulting Physician Gastroenterology  01/03/18   Devra Dopp, MD Referring Physician Dermatology  01/03/18    Loleta Books, MD Consulting Physician Ophthalmology  01/03/18   Delsa Bern, MD Consulting Physician Obstetrics and Gynecology  01/03/18   Lahoma Rocker, MD Consulting Physician Rheumatology  08/06/18     Objective  Vitals: BP 131/80   Pulse 83   Temp 98 F (36.7 C) (Temporal)   Ht 5\' 3"  (1.6 m)   Wt 211 lb 6.4 oz (95.9 kg)   LMP 09/08/2013   SpO2 97%   BMI 37.45 kg/m  General:  Well developed, well nourished, no acute distress  Psych:  Alert and orientedx3,normal mood and affect HEENT:  Normocephalic, atraumatic, non-icteric sclera,  supple neck without adenopathy, mass or thyromegaly Cardiovascular:  Normal S1, S2, RRR without gallop, rub or murmur Respiratory:  Good breath sounds bilaterally, CTAB with normal respiratory effort Gastrointestinal: normal bowel sounds, soft, non-tender, no noted masses. No HSM MSK: no deformities, contusions. Joints are without erythema or swelling.  Skin:  Warm, no rashes or suspicious lesions noted Neurologic:    Mental status is normal. CN 2-11 are normal. Gross motor and sensory exams are normal. Normal gait. No tremor    Commons side effects, risks, benefits, and alternatives for medications and treatment plan prescribed today were discussed, and the patient expressed understanding of the given instructions. Patient is instructed to call or message via MyChart if he/she has any questions or concerns regarding our treatment plan. No barriers to understanding were identified. We discussed Red Flag symptoms and signs in detail. Patient expressed understanding regarding what to do in case of urgent or emergency type symptoms.   Medication list was reconciled, printed and provided to the patient in AVS. Patient instructions and summary information was reviewed with the patient as documented in the AVS. This note was prepared with assistance of Dragon voice recognition software. Occasional wrong-word or sound-a-like substitutions may have  occurred due to the inherent limitations of voice recognition software  This visit occurred during the SARS-CoV-2 public health emergency.  Safety protocols were in place, including screening questions prior to the visit, additional usage of staff PPE, and extensive cleaning of exam room while observing appropriate contact time as indicated for disinfecting solutions.

## 2020-03-28 NOTE — Patient Instructions (Addendum)
Please return in 12 months for your annual complete physical; please come fasting.  I will release your lab results to you on your MyChart account with further instructions. Please reply with any questions.   Call to see if shingrix vaccinations are covered; if so, call for nurse visit so we can administer them to you.   We will call you to get your ultrasound scheduled.   If you have any questions or concerns, please don't hesitate to send me a message via MyChart or call the office at [phone]. Thank you for visiting with [place] today! It's our pleasure caring for you.   Preventive Care 33-56 Years Old, Female Preventive care refers to lifestyle choices and visits with your health care provider that can promote health and wellness. This includes:  A yearly physical exam. This is also called an annual wellness visit.  Regular dental and eye exams.  Immunizations.  Screening for certain conditions.  Healthy lifestyle choices, such as: ? Eating a healthy diet. ? Getting regular exercise. ? Not using drugs or products that contain nicotine and tobacco. ? Limiting alcohol use. What can I expect for my preventive care visit? Physical exam Your health care provider will check your:  Height and weight. These may be used to calculate your BMI (body mass index). BMI is a measurement that tells if you are at a healthy weight.  Heart rate and blood pressure.  Body temperature.  Skin for abnormal spots. Counseling Your health care provider may ask you questions about your:  Past medical problems.  Family's medical history.  Alcohol, tobacco, and drug use.  Emotional well-being.  Home life and relationship well-being.  Sexual activity.  Diet, exercise, and sleep habits.  Work and work Statistician.  Access to firearms.  Method of birth control.  Menstrual cycle.  Pregnancy history. What immunizations do I need? Vaccines are usually given at various ages, according  to a schedule. Your health care provider will recommend vaccines for you based on your age, medical history, and lifestyle or other factors, such as travel or where you work.   What tests do I need? Blood tests  Lipid and cholesterol levels. These may be checked every 5 years, or more often if you are over 21 years old.  Hepatitis C test.  Hepatitis B test. Screening  Lung cancer screening. You may have this screening every year starting at age 36 if you have a 30-pack-year history of smoking and currently smoke or have quit within the past 15 years.  Colorectal cancer screening. ? All adults should have this screening starting at age 73 and continuing until age 72. ? Your health care provider may recommend screening at age 72 if you are at increased risk. ? You will have tests every 1-10 years, depending on your results and the type of screening test.  Diabetes screening. ? This is done by checking your blood sugar (glucose) after you have not eaten for a while (fasting). ? You may have this done every 1-3 years.  Mammogram. ? This may be done every 1-2 years. ? Talk with your health care provider about when you should start having regular mammograms. This may depend on whether you have a family history of breast cancer.  BRCA-related cancer screening. This may be done if you have a family history of breast, ovarian, tubal, or peritoneal cancers.  Pelvic exam and Pap test. ? This may be done every 3 years starting at age 55. ? Starting at age 43,  this may be done every 5 years if you have a Pap test in combination with an HPV test. Other tests  STD (sexually transmitted disease) testing, if you are at risk.  Bone density scan. This is done to screen for osteoporosis. You may have this scan if you are at high risk for osteoporosis. Talk with your health care provider about your test results, treatment options, and if necessary, the need for more tests. Follow these instructions  at home: Eating and drinking  Eat a diet that includes fresh fruits and vegetables, whole grains, lean protein, and low-fat dairy products.  Take vitamin and mineral supplements as recommended by your health care provider.  Do not drink alcohol if: ? Your health care provider tells you not to drink. ? You are pregnant, may be pregnant, or are planning to become pregnant.  If you drink alcohol: ? Limit how much you have to 0-1 drink a day. ? Be aware of how much alcohol is in your drink. In the [place] one drink equals one 12 oz bottle of beer (355 mL), one 5 oz glass of wine (148 mL), or one 1 oz glass of hard liquor (44 mL).   Lifestyle  Take daily care of your teeth and gums. Brush your teeth every morning and night with fluoride toothpaste. Floss one time each day.  Stay active. Exercise for at least 30 minutes 5 or more days each week.  Do not use any products that contain nicotine or tobacco, such as cigarettes, e-cigarettes, and chewing tobacco. If you need help quitting, ask your health care provider.  Do not use drugs.  If you are sexually active, practice safe sex. Use a condom or other form of protection to prevent STIs (sexually transmitted infections).  If you do not wish to become pregnant, use a form of birth control. If you plan to become pregnant, see your health care provider for a prepregnancy visit.  If told by your health care provider, take low-dose aspirin daily starting at age 56.  Find healthy ways to cope with stress, such as: ? Meditation, yoga, or listening to music. ? Journaling. ? Talking to a trusted person. ? Spending time with friends and family. Safety  Always wear your seat belt while driving or riding in a vehicle.  Do not drive: ? If you have been drinking alcohol. Do not ride with someone who has been drinking. ? When you are tired or distracted. ? While texting.  Wear a helmet and other protective equipment during sports  activities.  If you have firearms in your house, make sure you follow all gun safety procedures. What's next?  Visit your health care provider once a year for an annual wellness visit.  Ask your health care provider how often you should have your eyes and teeth checked.  Stay up to date on all vaccines. This information is not intended to replace advice given to you by your health care provider. Make sure you discuss any questions you have with your health care provider. Document Revised: 11/06/2019 Document Reviewed: 10/13/2017 Elsevier Patient Education  2021 [employer].

## 2020-03-31 ENCOUNTER — Other Ambulatory Visit (INDEPENDENT_AMBULATORY_CARE_PROVIDER_SITE_OTHER): Payer: Commercial Managed Care - PPO

## 2020-03-31 ENCOUNTER — Encounter: Payer: Self-pay | Admitting: Family Medicine

## 2020-03-31 ENCOUNTER — Other Ambulatory Visit: Payer: Self-pay

## 2020-03-31 DIAGNOSIS — E038 Other specified hypothyroidism: Secondary | ICD-10-CM

## 2020-03-31 DIAGNOSIS — R7989 Other specified abnormal findings of blood chemistry: Secondary | ICD-10-CM | POA: Diagnosis not present

## 2020-03-31 DIAGNOSIS — Z0000 Encounter for general adult medical examination without abnormal findings: Secondary | ICD-10-CM

## 2020-03-31 DIAGNOSIS — E669 Obesity, unspecified: Secondary | ICD-10-CM

## 2020-03-31 DIAGNOSIS — E782 Mixed hyperlipidemia: Secondary | ICD-10-CM

## 2020-03-31 LAB — T4, FREE: Free T4: 0.65 ng/dL (ref 0.60–1.60)

## 2020-03-31 LAB — CBC WITH DIFFERENTIAL/PLATELET
Basophils Absolute: 0 10*3/uL (ref 0.0–0.1)
Basophils Relative: 0.6 % (ref 0.0–3.0)
Eosinophils Absolute: 0.1 10*3/uL (ref 0.0–0.7)
Eosinophils Relative: 2.3 % (ref 0.0–5.0)
HCT: 40.2 % (ref 36.0–46.0)
Hemoglobin: 13.7 g/dL (ref 12.0–15.0)
Lymphocytes Relative: 35.6 % (ref 12.0–46.0)
Lymphs Abs: 1.4 10*3/uL (ref 0.7–4.0)
MCHC: 34.1 g/dL (ref 30.0–36.0)
MCV: 92.9 fl (ref 78.0–100.0)
Monocytes Absolute: 0.3 10*3/uL (ref 0.1–1.0)
Monocytes Relative: 8.5 % (ref 3.0–12.0)
Neutro Abs: 2 10*3/uL (ref 1.4–7.7)
Neutrophils Relative %: 53 % (ref 43.0–77.0)
Platelets: 200 10*3/uL (ref 150.0–400.0)
RBC: 4.33 Mil/uL (ref 3.87–5.11)
RDW: 12.6 % (ref 11.5–15.5)
WBC: 3.8 10*3/uL — ABNORMAL LOW (ref 4.0–10.5)

## 2020-03-31 LAB — COMPREHENSIVE METABOLIC PANEL
ALT: 32 U/L (ref 0–35)
AST: 20 U/L (ref 0–37)
Albumin: 4.2 g/dL (ref 3.5–5.2)
Alkaline Phosphatase: 70 U/L (ref 39–117)
BUN: 14 mg/dL (ref 6–23)
CO2: 25 mEq/L (ref 19–32)
Calcium: 8.9 mg/dL (ref 8.4–10.5)
Chloride: 106 mEq/L (ref 96–112)
Creatinine, Ser: 0.74 mg/dL (ref 0.40–1.20)
GFR: 91.2 mL/min (ref >60.00)
Glucose, Bld: 113 mg/dL — ABNORMAL HIGH (ref 70–99)
Potassium: 4.1 mEq/L (ref 3.5–5.1)
Sodium: 140 mEq/L (ref 135–145)
Total Bilirubin: 0.5 mg/dL (ref 0.2–1.2)
Total Protein: 7 g/dL (ref 6.0–8.3)

## 2020-03-31 LAB — LIPID PANEL
Cholesterol: 159 mg/dL (ref 0–200)
HDL: 52.8 mg/dL (ref >39.00)
LDL Cholesterol: 81 mg/dL (ref 0–99)
NonHDL: 105.84
Total CHOL/HDL Ratio: 3
Triglycerides: 124 mg/dL (ref 0.0–149.0)
VLDL: 24.8 mg/dL (ref 0.0–40.0)

## 2020-03-31 LAB — HEMOGLOBIN A1C: Hgb A1c MFr Bld: 5.9 % (ref 4.6–6.5)

## 2020-03-31 LAB — TSH: TSH: 6.7 u[IU]/mL — ABNORMAL HIGH (ref 0.35–4.50)

## 2020-04-01 LAB — HEPATITIS C ANTIBODY
Hepatitis C Ab: NONREACTIVE
SIGNAL TO CUT-OFF: 0.01 (ref <1.00)

## 2020-04-01 LAB — HEPATITIS B SURFACE ANTIBODY,QUALITATIVE: Hep B S Ab: NONREACTIVE

## 2020-04-01 LAB — HEPATITIS B SURFACE ANTIGEN: Hepatitis B Surface Ag: NONREACTIVE

## 2020-04-01 LAB — T3: T3, Total: 107 ng/dL (ref 76–181)

## 2020-04-23 ENCOUNTER — Other Ambulatory Visit: Payer: Self-pay

## 2020-04-23 ENCOUNTER — Encounter: Payer: Self-pay | Admitting: Family Medicine

## 2020-04-23 DIAGNOSIS — R5383 Other fatigue: Secondary | ICD-10-CM

## 2020-04-23 DIAGNOSIS — E785 Hyperlipidemia, unspecified: Secondary | ICD-10-CM

## 2020-04-23 DIAGNOSIS — N951 Menopausal and female climacteric states: Secondary | ICD-10-CM

## 2020-04-23 MED ORDER — ROSUVASTATIN CALCIUM 5 MG PO TABS: 5.0000 mg | ORAL_TABLET | Freq: Every day | ORAL | 3 refills | Status: DC

## 2020-04-23 MED ORDER — FLUOXETINE HCL 20 MG PO CAPS: 40.0000 mg | ORAL_CAPSULE | Freq: Every day | ORAL | 3 refills | Status: DC

## 2020-04-23 NOTE — Telephone Encounter (Signed)
Please schedule lab visit and vit D. And notify pt. thanks

## 2020-04-24 ENCOUNTER — Other Ambulatory Visit (INDEPENDENT_AMBULATORY_CARE_PROVIDER_SITE_OTHER): Payer: Commercial Managed Care - PPO

## 2020-04-24 ENCOUNTER — Other Ambulatory Visit: Payer: Self-pay

## 2020-04-24 DIAGNOSIS — R5383 Other fatigue: Secondary | ICD-10-CM | POA: Diagnosis not present

## 2020-04-24 LAB — VITAMIN D 25 HYDROXY (VIT D DEFICIENCY, FRACTURES): VITD: 44.03 ng/mL (ref 30.00–100.00)

## 2020-04-25 ENCOUNTER — Encounter: Payer: Self-pay | Admitting: Family Medicine

## 2020-05-15 ENCOUNTER — Encounter: Payer: Self-pay | Admitting: Family Medicine

## 2020-05-23 ENCOUNTER — Encounter: Payer: Self-pay | Admitting: Family Medicine

## 2020-05-23 ENCOUNTER — Ambulatory Visit: Payer: Commercial Managed Care - PPO | Admitting: Family Medicine

## 2020-05-23 ENCOUNTER — Other Ambulatory Visit: Payer: Self-pay

## 2020-05-23 VITALS — BP 130/86 | HR 86 | Temp 97.4°F | Wt 210.6 lb

## 2020-05-23 DIAGNOSIS — M1990 Unspecified osteoarthritis, unspecified site: Secondary | ICD-10-CM

## 2020-05-23 DIAGNOSIS — M5416 Radiculopathy, lumbar region: Secondary | ICD-10-CM

## 2020-05-23 DIAGNOSIS — G4733 Obstructive sleep apnea (adult) (pediatric): Secondary | ICD-10-CM | POA: Diagnosis not present

## 2020-05-23 DIAGNOSIS — E038 Other specified hypothyroidism: Secondary | ICD-10-CM | POA: Diagnosis not present

## 2020-05-23 DIAGNOSIS — R400 Somnolence: Secondary | ICD-10-CM

## 2020-05-23 DIAGNOSIS — F411 Generalized anxiety disorder: Secondary | ICD-10-CM | POA: Diagnosis not present

## 2020-05-23 MED ORDER — LEVOTHYROXINE SODIUM 25 MCG PO TABS: 25.0000 ug | ORAL_TABLET | Freq: Every day | ORAL | 3 refills | Status: DC

## 2020-05-23 NOTE — Patient Instructions (Signed)
Please return in 8 weeks for thyroid recheck and follow up on your fatigue.   We will call you to get you scheduled with sleep medicine for further evaluation.  Please start the low dose levothyroxine daily to see if this helps with your fatigue.

## 2020-05-23 NOTE — Progress Notes (Signed)
Subjective  CC:  Chief Complaint  Patient presents with  . Fatigue    If not up and moving, she is asleep. Body aches daily. Sleep study "quite a few years ago - showed mild apnea" requesting to have updated test done     HPI: Terri Jensen is a 55 y.o. female who presents to the office today to address the problems listed above in the chief complaint.  55 yo recently here for CPE; has h/o subclinical hypothyroidism,  GAD, possible seronegative inflammatory arthritis, low back pain presents to worsening daytime somnolence. Falls asleep easily: while passenger in the car, watching TV in am or at night, if sits to read etc. Does fine at work or if active. Feels more tired overall and knows she is not sleeping well. + snoring, no gasping for air. Has chronic low back pain that has been more active but denies interfering with sleep. Has arthritis but that is improved overall. We stopped levothyroxine a while back and TSH is just above normal w/ low normal T hormones. Not sure if that has contributed to fatigue. Labs were overall reassuring from CPE. See below. Denies h/o insomnia. Denies worry at night.   No mood concerns. Does well on prozac.    Assessment  1. Daytime somnolence   2. Subclinical hypothyroidism   3. Obstructive sleep apnea hypopnea, mild   4. GAD (generalized anxiety disorder)   5. Lumbar radiculopathy   6. Inflammatory arthritis      Plan   Somnolence and fatigue with h/o mild OSA on sleep study 2018 (reviewed):  Agree with referral back to sleep med for recheck. May have progressed.   Start low dose thyroid supplement to see if that helps. Will push TSH to 2.   Do not feel back pain or arthritis is a current contributor to fatigue. Continue prn nsaids.   GAD is well controlled. No sxs of depression contributing to fatigue concerns.   Follow up: 8 weeks for recheck and lab  Visit date not found  Orders Placed This Encounter  Procedures  . Ambulatory  referral to Pulmonology   Meds ordered this encounter  Medications  . levothyroxine (SYNTHROID) 25 MCG tablet    Sig: Take 1 tablet (25 mcg total) by mouth daily.    Dispense:  90 tablet    Refill:  3      I reviewed the patients updated PMH, FH, and SocHx.    Patient Active Problem List   Diagnosis Date Noted  . GAD (generalized anxiety disorder) 03/16/2019    Priority: High  . Subclinical hypothyroidism 08/05/2016    Priority: High  . Obesity (BMI 35.0-39.9 without comorbidity) 04/05/2016    Priority: High  . Mixed hyperlipidemia 04/05/2016    Priority: High  . Elevated LFTs 03/28/2020    Priority: Medium  . Postmenopausal HRT (hormone replacement therapy) 03/16/2019    Priority: Medium  . Lichen sclerosus of female genitalia 12/20/2018    Priority: Medium  . Lumbar radiculopathy 02/21/2018    Priority: Medium  . Inflammatory arthritis 01/03/2018    Priority: Medium  . Kidney stones     Priority: Medium  . Low vitamin D level 04/05/2016    Priority: Low   Current Meds  Medication Sig  . EPINEPHrine 0.3 mg/0.3 mL IJ SOAJ injection epinephrine 0.3 mg/0.3 mL injection, auto-injector  . FLUoxetine (PROZAC) 20 MG capsule Take 2 capsules (40 mg total) by mouth daily.  Marland Kitchen levothyroxine (SYNTHROID) 25 MCG tablet Take 1  tablet (25 mcg total) by mouth daily.  . Multiple Vitamins-Minerals (MULTIVITAMIN ADULT) CHEW Chew 2 each by mouth daily.  . rosuvastatin (CRESTOR) 5 MG tablet Take 1 tablet (5 mg total) by mouth daily.  Marland Kitchen tretinoin (RETIN-A) 0.1 % cream APPLY TO AFFECTED AREA EVERY DAY  . Vitamin D, Cholecalciferol, 50 MCG (2000 UT) CAPS Take by mouth.  Merril Abbe 10 MCG TABS vaginal tablet Place 1 tablet vaginally 2 (two) times a week.    Allergies: Patient is allergic to shellfish allergy, tape, codeine, and sulfa antibiotics. Family History: Patient family history includes Breast cancer in her mother; COPD in her father and mother; Cancer (age of onset: 57) in her  paternal grandmother; Colon cancer in her paternal grandmother; Diabetes in her father and mother; Heart disease in her father and mother; Hypertension in her brother, father, and mother; Kidney disease in her father; Liver disease in her father; Stroke in her mother; Thyroid disease in her mother. Social History:  Patient  reports that she quit smoking about 30 years ago. Her smoking use included cigarettes. She has a 7.00 pack-year smoking history. She has never used smokeless tobacco. She reports current alcohol use of about 6.0 - 8.0 standard drinks of alcohol per week. She reports that she does not use drugs.  Review of Systems: Constitutional: Negative for fever malaise or anorexia Cardiovascular: negative for chest pain Respiratory: negative for SOB or persistent cough Gastrointestinal: negative for abdominal pain  Objective  Vitals: BP 130/86   Pulse 86   Temp (!) 97.4 F (36.3 C) (Temporal)   Wt 210 lb 9.6 oz (95.5 kg)   LMP 09/08/2013   SpO2 98%   BMI 37.31 kg/m  General: no acute distress , A&Ox3, obese Psych: normal mood and affect, good insight. Well groomed.   No visits with results within 1 Day(s) from this visit.  Latest known visit with results is:  Lab on 04/24/2020  Component Date Value Ref Range Status  . VITD 04/24/2020 44.03  30.00 - 100.00 ng/mL Final   Lab Results  Component Value Date   TSH 6.70 (H) 03/31/2020   Lab Results  Component Value Date   CREATININE 0.74 03/31/2020   BUN 14 03/31/2020   NA 140 03/31/2020   K 4.1 03/31/2020   CL 106 03/31/2020   CO2 25 03/31/2020   Lab Results  Component Value Date   WBC 3.8 (L) 03/31/2020   HGB 13.7 03/31/2020   HCT 40.2 03/31/2020   MCV 92.9 03/31/2020   PLT 200.0 03/31/2020   Lab Results  Component Value Date   VD25OH 44.03 04/24/2020      Commons side effects, risks, benefits, and alternatives for medications and treatment plan prescribed today were discussed, and the patient expressed  understanding of the given instructions. Patient is instructed to call or message via MyChart if he/she has any questions or concerns regarding our treatment plan. No barriers to understanding were identified. We discussed Red Flag symptoms and signs in detail. Patient expressed understanding regarding what to do in case of urgent or emergency type symptoms.   Medication list was reconciled, printed and provided to the patient in AVS. Patient instructions and summary information was reviewed with the patient as documented in the AVS. This note was prepared with assistance of Dragon voice recognition software. Occasional wrong-word or sound-a-like substitutions may have occurred due to the inherent limitations of voice recognition software  This visit occurred during the SARS-CoV-2 public health emergency.  Safety protocols were  in place, including screening questions prior to the visit, additional usage of staff PPE, and extensive cleaning of exam room while observing appropriate contact time as indicated for disinfecting solutions.

## 2020-06-23 ENCOUNTER — Ambulatory Visit: Payer: Commercial Managed Care - PPO | Admitting: Family Medicine

## 2020-06-24 ENCOUNTER — Ambulatory Visit (INDEPENDENT_AMBULATORY_CARE_PROVIDER_SITE_OTHER): Payer: Commercial Managed Care - PPO | Admitting: Pulmonary Disease

## 2020-06-24 ENCOUNTER — Other Ambulatory Visit: Payer: Self-pay

## 2020-06-24 ENCOUNTER — Encounter: Payer: Self-pay | Admitting: Pulmonary Disease

## 2020-06-24 VITALS — BP 132/80 | HR 71 | Temp 97.3°F | Ht 64.0 in | Wt 213.4 lb

## 2020-06-24 DIAGNOSIS — R0683 Snoring: Secondary | ICD-10-CM | POA: Diagnosis not present

## 2020-06-24 NOTE — Progress Notes (Signed)
Terri Jensen    [id number]    1965/11/02  Primary Care Physician:Andy, Karie Fetch, MD  Referring Physician: Leamon Arnt, [place],  [place] [address]  Chief complaint:   Patient with poor sleep, snoring, multiple awakenings  HPI:  She did have a sleep study done about 5 years ago that showed mild to moderate sleep apnea is what she remembers -Watchful waiting was recommended at the time  Symptoms a little more prominent She does have daytime sleepiness Multiple awakenings  Usually tries to go to bed between 10 and 11 Takes a 10 to 15 minutes to fall asleep about 3 awakenings Final wake up time about 630  Admits to dryness of her mouth occasionally, no morning headaches Both parents snored Reformed smoker No regular exercises   Outpatient Encounter Medications as of 06/24/2020  Medication Sig  . EPINEPHrine 0.3 mg/0.3 mL IJ SOAJ injection epinephrine 0.3 mg/0.3 mL injection, auto-injector  . FLUoxetine (PROZAC) 20 MG capsule Take 2 capsules (40 mg total) by mouth daily.  Marland Kitchen levothyroxine (SYNTHROID) 25 MCG tablet Take 1 tablet (25 mcg total) by mouth daily.  . Multiple Vitamins-Minerals (MULTIVITAMIN ADULT) CHEW Chew 2 each by mouth daily.  . rosuvastatin (CRESTOR) 5 MG tablet Take 1 tablet (5 mg total) by mouth daily.  Marland Kitchen tretinoin (RETIN-A) 0.1 % cream APPLY TO AFFECTED AREA EVERY DAY  . Vitamin D, Cholecalciferol, 50 MCG (2000 UT) CAPS Take by mouth.  Merril Abbe 10 MCG TABS vaginal tablet Place 1 tablet vaginally 2 (two) times a week.   No facility-administered encounter medications on file as of 06/24/2020.    Allergies as of 06/24/2020 - Review Complete 06/24/2020  Allergen Reaction Noted  . Shellfish allergy Anaphylaxis 07/29/2011  . Tape Rash 07/29/2011  . Codeine Nausea And Vomiting 04/05/2016  . Sulfa antibiotics Rash 07/23/2016    Past Medical History:  Diagnosis Date  . Anxiety   . History of kidney stones   .  History of shingles   . History of varicella   . HLD (hyperlipidemia)   . Hypothyroidism   . Low vitamin D level   . Perimenopause   . PONV (postoperative nausea and vomiting)   . Postmenopausal HRT (hormone replacement therapy) 03/16/2019  . Prediabetes     Past Surgical History:  Procedure Laterality Date  . AUGMENTATION MAMMAPLASTY Bilateral   . BREAST EXCISIONAL BIOPSY Left 2019  . CESAREAN SECTION    . CHOLECYSTECTOMY    . [place]  2005  . RADIOACTIVE SEED GUIDED EXCISIONAL BREAST BIOPSY Left 05/04/2017   Procedure: RADIOACTIVE SEED GUIDED EXCISIONAL BREAST BIOPSY;  Surgeon: Rolm Bookbinder, MD;  Location: [facility];  Service: General;  Laterality: Left;  . WISDOM TOOTH EXTRACTION      Family History  Problem Relation Age of Onset  . Heart disease Mother   . COPD Mother        Emphysema  . Thyroid disease Mother   . Hypertension Mother   . Diabetes Mother   . Stroke Mother   . Breast cancer Mother   . Heart disease Father   . COPD Father        Emphysema  . Hypertension Father   . Diabetes Father   . Kidney disease Father   . Liver disease Father   . Hypertension Brother   . Cancer Paternal Grandmother 32       colon  . Colon cancer Paternal Grandmother  Social History   Socioeconomic History  . Marital status: Married    Spouse name: Not on file  . Number of children: 2  . Years of education: Not on file  . Highest education level: Not on file  Occupational History  . Occupation: Admin    Employer: [employer]  Tobacco Use  . Smoking status: Former Smoker    Packs/day: 1.00    Years: 7.00    Pack years: 7.00    Types: Cigarettes    Quit date: 04/23/1990    Years since quitting: 30.1  . Smokeless tobacco: Never Used  Vaping Use  . Vaping Use: Never used  Substance and Sexual Activity  . Alcohol use: Yes    Alcohol/week: 6.0 - 8.0 standard drinks    Types: 4 - 6 Glasses of wine, 2 Standard drinks or equivalent per week  . Drug use: No   . Sexual activity: Yes    Birth control/protection: Other-see comments    Comment: VAS  Other Topics Concern  . Not on file  Social History Narrative  . Not on file   Social Determinants of Health   Financial Resource Strain: Not on file  Food Insecurity: Not on file  Transportation Needs: Not on file  Physical Activity: Not on file  Stress: Not on file  Social Connections: Not on file  Intimate Partner Violence: Not on file    Review of Systems  Constitutional: Positive for fatigue.  Respiratory: Negative for shortness of breath.   Psychiatric/Behavioral: Positive for sleep disturbance.    Vitals:   06/24/20 1103  BP: 132/80  Pulse: 71  Temp: (!) 97.3 F (36.3 C)  SpO2: 99%     Physical Exam Constitutional:      Appearance: She is obese.  HENT:     Mouth/Throat:     Mouth: Mucous membranes are moist.     Comments: Macroglossia, crowded oropharynx, Mallampati 3 Eyes:     Pupils: Pupils are equal, round, and reactive to light.  Cardiovascular:     Rate and Rhythm: Normal rate.     Heart sounds: No murmur heard. No friction rub.  Pulmonary:     Effort: No respiratory distress.     Breath sounds: No stridor. No wheezing.  Musculoskeletal:     Cervical back: No rigidity or tenderness.  Neurological:     Mental Status: She is alert.  Psychiatric:        Mood and Affect: Mood normal.   Epworth Sleepiness Scale of 10  Data Reviewed: Previous study not available for review  Assessment:  Moderate probability of significant obstructive sleep apnea  Excessive daytime sleepiness  Pathophysiology of sleep disordered breathing discussed with the patient Treatment options for sleep disordered breathing discussed with the patient  Plan/Recommendations: We will schedule the patient for home sleep study  Will be updated with results as soon as reviewed  Graded exercises as tolerated  Tentative follow-up in 3 to 4 months  Encouraged to call with any  significant concerns   Sherrilyn Rist MD [facility] 06/24/2020, 11:16 AM  CC: Leamon Arnt, MD

## 2020-06-24 NOTE — Patient Instructions (Addendum)
Moderate probability of significant obstructive sleep apnea  We will schedule you for home sleep study Treatment options as we discussed We will update you with results as soon as study is reviewed  Follow-up in 3 to 4 months  Regular exercises as tolerated Sleep Apnea Sleep apnea affects breathing during sleep. It causes breathing to stop for a short time or to become shallow. It can also increase the risk of:  Heart attack.  Stroke.  Being very overweight (obese).  Diabetes.  Heart failure.  Irregular heartbeat. The goal of treatment is to help you breathe normally again. What are the causes? There are three kinds of sleep apnea:  Obstructive sleep apnea. This is caused by a blocked or collapsed airway.  Central sleep apnea. This happens when the brain does not send the right signals to the muscles that control breathing.  Mixed sleep apnea. This is a combination of obstructive and central sleep apnea. The most common cause of this condition is a collapsed or blocked airway. This can happen if:  Your throat muscles are too relaxed.  Your tongue and tonsils are too large.  You are overweight.  Your airway is too small.   What increases the risk?  Being overweight.  Smoking.  Having a small airway.  Being older.  Being female.  Drinking alcohol.  Taking medicines to calm yourself (sedatives or tranquilizers).  Having family members with the condition. What are the signs or symptoms?  Trouble staying asleep.  Being sleepy or tired during the day.  Getting angry a lot.  Loud snoring.  Headaches in the morning.  Not being able to focus your mind (concentrate).  Forgetting things.  Less interest in sex.  Mood swings.  Personality changes.  Feelings of sadness (depression).  Waking up a lot during the night to pee (urinate).  Dry mouth.  Sore throat. How is this diagnosed?  Your medical history.  A physical exam.  A test that is  done when you are sleeping (sleep study). The test is most often done in a sleep lab but may also be done at home. How is this treated?  Sleeping on your side.  Using a medicine to get rid of mucus in your nose (decongestant).  Avoiding the use of alcohol, medicines to help you relax, or certain pain medicines (narcotics).  Losing weight, if needed.  Changing your diet.  Not smoking.  Using a machine to open your airway while you sleep, such as: ? An oral appliance. This is a mouthpiece that shifts your lower jaw forward. ? A CPAP device. This device blows air through a mask when you breathe out (exhale). ? An EPAP device. This has valves that you put in each nostril. ? A BPAP device. This device blows air through a mask when you breathe in (inhale) and breathe out.  Having surgery if other treatments do not work. It is important to get treatment for sleep apnea. Without treatment, it can lead to:  High blood pressure.  Coronary artery disease.  In men, not being able to have an erection (impotence).  Reduced thinking ability.   Follow these instructions at home: Lifestyle  Make changes that your doctor recommends.  Eat a healthy diet.  Lose weight if needed.  Avoid alcohol, medicines to help you relax, and some pain medicines.  Do not use any products that contain nicotine or tobacco, such as cigarettes, e-cigarettes, and chewing tobacco. If you need help quitting, ask your doctor. General instructions  Take over-the-counter and prescription medicines only as told by your doctor.  If you were given a machine to use while you sleep, use it only as told by your doctor.  If you are having surgery, make sure to tell your doctor you have sleep apnea. You may need to bring your device with you.  Keep all follow-up visits as told by your doctor. This is important. Contact a doctor if:  The machine that you were given to use during sleep bothers you or does not seem to  be working.  You do not get better.  You get worse. Get help right away if:  Your chest hurts.  You have trouble breathing in enough air.  You have an uncomfortable feeling in your back, arms, or stomach.  You have trouble talking.  One side of your body feels weak.  A part of your face is hanging down. These symptoms may be an emergency. Do not wait to see if the symptoms will go away. Get medical help right away. Call your local emergency services (911 in the [place] Do not drive yourself to the hospital. Summary  This condition affects breathing during sleep.  The most common cause is a collapsed or blocked airway.  The goal of treatment is to help you breathe normally while you sleep. This information is not intended to replace advice given to you by your health care provider. Make sure you discuss any questions you have with your health care provider. Document Revised: 11/18/2017 Document Reviewed: 09/27/2017 Elsevier Patient Education  2021 [employer]

## 2020-07-18 ENCOUNTER — Other Ambulatory Visit: Payer: Self-pay

## 2020-07-18 ENCOUNTER — Ambulatory Visit: Payer: Commercial Managed Care - PPO

## 2020-07-18 DIAGNOSIS — G4733 Obstructive sleep apnea (adult) (pediatric): Secondary | ICD-10-CM

## 2020-07-18 DIAGNOSIS — R0683 Snoring: Secondary | ICD-10-CM

## 2020-07-23 ENCOUNTER — Encounter: Payer: Self-pay | Admitting: Family Medicine

## 2020-07-23 ENCOUNTER — Ambulatory Visit: Payer: Commercial Managed Care - PPO | Admitting: Family Medicine

## 2020-07-23 ENCOUNTER — Telehealth: Payer: Self-pay | Admitting: Pulmonary Disease

## 2020-07-23 ENCOUNTER — Other Ambulatory Visit: Payer: Self-pay

## 2020-07-23 VITALS — BP 116/74 | HR 73 | Temp 98.1°F | Ht 64.0 in | Wt 206.0 lb

## 2020-07-23 DIAGNOSIS — G4733 Obstructive sleep apnea (adult) (pediatric): Secondary | ICD-10-CM

## 2020-07-23 DIAGNOSIS — E038 Other specified hypothyroidism: Secondary | ICD-10-CM | POA: Diagnosis not present

## 2020-07-23 DIAGNOSIS — R400 Somnolence: Secondary | ICD-10-CM | POA: Diagnosis not present

## 2020-07-23 LAB — T3: T3, Total: 91 ng/dL (ref 76–181)

## 2020-07-23 NOTE — Patient Instructions (Addendum)
Please follow up if symptoms do not improve or as needed.    Here is the copied note from Dr. Gala Murdoch (today at 2:30pm) that I see in your chart:  They will call you to discuss your results:\  "Call patient Sleep study result Date of study: 07/20/2020  Impression: Mild obstructive sleep apnea Mild oxygen desaturations  Recommendation:  Options of treatment for mild sleep disordered breathing will include  1.CPAP therapy if there is significant daytime sleepiness or other comorbidities including history of CVA or cardiac disease -If CPAP is chosen as an option of treatment auto titrating CPAP with a pressure setting of 5-15 will be appropriate  2.Watchful waiting with emphasis on weight loss, sleep position modification to optimize lateral sleep, elevating the head of the bed by about 30 degrees may also help  3. An oral device may be fashioned for the treatment of mild sleep disordered breathing, will involve referral to dentist.  Follow up as previously discussed"   Sleep Apnea Sleep apnea is a condition in which breathing pauses or becomes shallow during sleep. Episodes of sleep apnea usually last 10 seconds or longer, and they may occur as many as 20 times an hour. Sleep apnea disrupts your sleep and keeps your body from getting the rest that it needs. This condition can increase your risk of certain health problems, including:  Heart attack.  Stroke.  Obesity.  Diabetes.  Heart failure.  Irregular heartbeat. What are the causes? There are three kinds of sleep apnea:  Obstructive sleep apnea. This kind is caused by a blocked or collapsed airway.  Central sleep apnea. This kind happens when the part of the brain that controls breathing does not send the correct signals to the muscles that control breathing.  Mixed sleep apnea. This is a combination of obstructive and central sleep apnea. The most common cause of this condition is a collapsed or blocked  airway. An airway can collapse or become blocked if:  Your throat muscles are abnormally relaxed.  Your tongue and tonsils are larger than normal.  You are overweight.  Your airway is smaller than normal.   What increases the risk? You are more likely to develop this condition if you:  Are overweight.  Smoke.  Have a smaller than normal airway.  Are elderly.  Are female.  Drink alcohol.  Take sedatives or tranquilizers.  Have a family history of sleep apnea. What are the signs or symptoms? Symptoms of this condition include:  Trouble staying asleep.  Daytime sleepiness and tiredness.  Irritability.  Loud snoring.  Morning headaches.  Trouble concentrating.  Forgetfulness.  Decreased interest in sex.  Unexplained sleepiness.  Mood swings.  Personality changes.  Feelings of depression.  Waking up often during the night to urinate.  Dry mouth.  Sore throat. How is this diagnosed? This condition may be diagnosed with:  A medical history.  A physical exam.  A series of tests that are done while you are sleeping (sleep study). These tests are usually done in a sleep lab, but they may also be done at home. How is this treated? Treatment for this condition aims to restore normal breathing and to ease symptoms during sleep. It may involve managing health issues that can affect breathing, such as high blood pressure or obesity. Treatment may include:  Sleeping on your side.  Using a decongestant if you have nasal congestion.  Avoiding the use of depressants, including alcohol, sedatives, and narcotics.  Losing weight if you are overweight.  Making changes to your diet.  Quitting smoking.  Using a device to open your airway while you sleep, such as: ? An oral appliance. This is a custom-made mouthpiece that shifts your lower jaw forward. ? A continuous positive airway pressure (CPAP) device. This device blows air through a mask when you breathe  out (exhale). ? A nasal expiratory positive airway pressure (EPAP) device. This device has valves that you put into each nostril. ? A bi-level positive airway pressure (BPAP) device. This device blows air through a mask when you breathe in (inhale) and breathe out (exhale).  Having surgery if other treatments do not work. During surgery, excess tissue is removed to create a wider airway. It is important to get treatment for sleep apnea. Without treatment, this condition can lead to:  High blood pressure.  Coronary artery disease.  In men, an inability to achieve or maintain an erection (impotence).  Reduced thinking abilities.   Follow these instructions at home: Lifestyle  Make any lifestyle changes that your health care provider recommends.  Eat a healthy, well-balanced diet.  Take steps to lose weight if you are overweight.  Avoid using depressants, including alcohol, sedatives, and narcotics.  Do not use any products that contain nicotine or tobacco, such as cigarettes, e-cigarettes, and chewing tobacco. If you need help quitting, ask your health care provider. General instructions  Take over-the-counter and prescription medicines only as told by your health care provider.  If you were given a device to open your airway while you sleep, use it only as told by your health care provider.  If you are having surgery, make sure to tell your health care provider you have sleep apnea. You may need to bring your device with you.  Keep all follow-up visits as told by your health care provider. This is important. Contact a health care provider if:  The device that you received to open your airway during sleep is uncomfortable or does not seem to be working.  Your symptoms do not improve.  Your symptoms get worse. Get help right away if:  You develop: ? Chest pain. ? Shortness of breath. ? Discomfort in your back, arms, or stomach.  You have: ? Trouble speaking. ? Weakness  on one side of your body. ? Drooping in your face. These symptoms may represent a serious problem that is an emergency. Do not wait to see if the symptoms will go away. Get medical help right away. Call your local emergency services (911 in the [place] Do not drive yourself to the hospital. Summary  Sleep apnea is a condition in which breathing pauses or becomes shallow during sleep.  The most common cause is a collapsed or blocked airway.  The goal of treatment is to restore normal breathing and to ease symptoms during sleep. This information is not intended to replace advice given to you by your health care provider. Make sure you discuss any questions you have with your health care provider. Document Revised: 07/19/2018 Document Reviewed: 09/27/2017 Elsevier Patient Education  2021 [employer].

## 2020-07-23 NOTE — Telephone Encounter (Signed)
Call patient  Sleep study result  Date of study: 07/20/2020  Impression: Mild obstructive sleep apnea Mild oxygen desaturations  Recommendation:  Options of treatment for mild sleep disordered breathing will include   1.  CPAP therapy if there is significant daytime sleepiness or other comorbidities including history of CVA or cardiac disease -If CPAP is chosen as an option of treatment auto titrating CPAP with a pressure setting of 5-15 will be appropriate   2.  Watchful waiting with emphasis on weight loss, sleep position modification to optimize lateral sleep, elevating the head of the bed by about 30 degrees may also help   3.  An oral device may be fashioned for the treatment of mild sleep disordered breathing, will involve referral to dentist.  Follow up as previously discussed

## 2020-07-23 NOTE — Progress Notes (Signed)
Subjective  CC:  Chief Complaint  Patient presents with  . Thyroid Problem  . Follow-up    HPI: Terri Jensen is a 55 y.o. female who presents to the office today to address the problems listed above in the chief complaint.  Follow-up for daytime somnolence and snoring: Please review last note.  Briefly summarize: Sleeping is disrupted by snoring, was having difficulty with falling asleep during the day whenever she was an active.  She was sent for sleep study and we restarted her on low-dose levothyroxine for subclinical hypothyroidism.  She reports she is feeling much better.  No longer having daytime somnolence or if so, only mildly.  She feels better on the levothyroxine.  No symptoms of hyperthyroidism endorsed.  I reviewed her pulmonology evaluation.  Sleep study was done on June 5 and showed mild obstructive sleep apnea with desaturations.  She will be notified by the pulmonologist and given options of treatment: They include CPAP, lifestyle changes with sleep hygiene changes or oral device.  We discussed his options.  She likely try the CPAP.  She will follow-up with pulmonology office.  She reports no other new symptoms.  Assessment  1. OSA (obstructive sleep apnea)   2. Daytime somnolence   3. Subclinical hypothyroidism      Plan   Obstructive sleep apnea, new diagnosis: Discussed diagnosis and treatment options.  Recommend CPAP trial.  We will arrange with pulmonology to get started.  Daytime somnolence improved with treatment of her subclinical hypothyroidism.  We will check TSH and thyroid hormones today.  We will push TSH to below 2.  Discussed treatment plan in detail.  Follow up: As needed.  CPE in February.  Sooner if thyroid medications need adjustment. Visit date not found  Orders Placed This Encounter  Procedures  . T3  . T4, free  . TSH   No orders of the defined types were placed in this encounter.     I reviewed the patients updated PMH, FH, and  SocHx.    Patient Active Problem List   Diagnosis Date Noted  . GAD (generalized anxiety disorder) 03/16/2019    Priority: High  . Subclinical hypothyroidism 08/05/2016    Priority: High  . Obesity (BMI 35.0-39.9 without comorbidity) 04/05/2016    Priority: High  . Mixed hyperlipidemia 04/05/2016    Priority: High  . Elevated LFTs 03/28/2020    Priority: Medium  . Postmenopausal HRT (hormone replacement therapy) 03/16/2019    Priority: Medium  . Lichen sclerosus of female genitalia 12/20/2018    Priority: Medium  . Lumbar radiculopathy 02/21/2018    Priority: Medium  . Inflammatory arthritis 01/03/2018    Priority: Medium  . Kidney stones     Priority: Medium  . Low vitamin D level 04/05/2016    Priority: Low   Current Meds  Medication Sig  . EPINEPHrine 0.3 mg/0.3 mL IJ SOAJ injection epinephrine 0.3 mg/0.3 mL injection, auto-injector  . FLUoxetine (PROZAC) 20 MG capsule Take 2 capsules (40 mg total) by mouth daily.  Marland Kitchen levothyroxine (SYNTHROID) 25 MCG tablet Take 1 tablet (25 mcg total) by mouth daily.  . Multiple Vitamins-Minerals (MULTIVITAMIN ADULT) CHEW Chew 2 each by mouth daily.  . rosuvastatin (CRESTOR) 5 MG tablet Take 1 tablet (5 mg total) by mouth daily.  Marland Kitchen tretinoin (RETIN-A) 0.1 % cream APPLY TO AFFECTED AREA EVERY DAY  . Vitamin D, Cholecalciferol, 50 MCG (2000 UT) CAPS Take by mouth.  Merril Abbe 10 MCG TABS vaginal tablet Place 1 tablet  vaginally 2 (two) times a week.    Allergies: Patient is allergic to shellfish allergy, tape, codeine, and sulfa antibiotics. Family History: Patient family history includes Breast cancer in her mother; COPD in her father and mother; Cancer (age of onset: 17) in her paternal grandmother; Colon cancer in her paternal grandmother; Diabetes in her father and mother; Heart disease in her father and mother; Hypertension in her brother, father, and mother; Kidney disease in her father; Liver disease in her father; Stroke in her  mother; Thyroid disease in her mother. Social History:  Patient  reports that she quit smoking about 30 years ago. Her smoking use included cigarettes. She has a 7.00 pack-year smoking history. She has never used smokeless tobacco. She reports current alcohol use of about 6.0 - 8.0 standard drinks of alcohol per week. She reports that she does not use drugs.  Review of Systems: Constitutional: Negative for fever malaise or anorexia Cardiovascular: negative for chest pain Respiratory: negative for SOB or persistent cough Gastrointestinal: negative for abdominal pain  Objective  Vitals: BP 116/74   Pulse 73   Temp 98.1 F (36.7 C) (Temporal)   Ht 5\' 4"  (1.626 m)   Wt 206 lb (93.4 kg)   LMP 09/08/2013   SpO2 97%   BMI 35.36 kg/m  General: no acute distress , A&Ox3    Commons side effects, risks, benefits, and alternatives for medications and treatment plan prescribed today were discussed, and the patient expressed understanding of the given instructions. Patient is instructed to call or message via MyChart if he/she has any questions or concerns regarding our treatment plan. No barriers to understanding were identified. We discussed Red Flag symptoms and signs in detail. Patient expressed understanding regarding what to do in case of urgent or emergency type symptoms.   Medication list was reconciled, printed and provided to the patient in AVS. Patient instructions and summary information was reviewed with the patient as documented in the AVS. This note was prepared with assistance of Dragon voice recognition software. Occasional wrong-word or sound-a-like substitutions may have occurred due to the inherent limitations of voice recognition software  This visit occurred during the SARS-CoV-2 public health emergency.  Safety protocols were in place, including screening questions prior to the visit, additional usage of staff PPE, and extensive cleaning of exam room while observing appropriate  contact time as indicated for disinfecting solutions.

## 2020-07-24 LAB — TSH: TSH: 2.91 u[IU]/mL (ref 0.35–4.50)

## 2020-07-24 LAB — T4, FREE: Free T4: 0.73 ng/dL (ref 0.60–1.60)

## 2020-07-24 NOTE — Telephone Encounter (Signed)
I called and spoke with patient regarding HST results and Dr. Roxanne Mins. Patient verbalized understanding and is agreeable to CPAP. I sent in order with given pressures and informed patient that the DME company will reach out next. I also informed the patient about the recall and delay. Patient verbalized understanding, nothing further needed.

## 2020-08-27 IMAGING — [facility] MM  DIGITAL SCREENING BREAST BILAT IMPLANT W/ TOMO W/ CAD
8 of 12 series · 8 of 28 positions shown · non-contrast
Comparison: Previous exam(s).

CLINICAL DATA: Screening.

EXAM:
DIGITAL SCREENING BILATERAL MAMMOGRAM WITH IMPLANTS, CAD AND TOMO
The patient has retropectoral implants. Standard and implant
displaced views were performed.

[R CC]
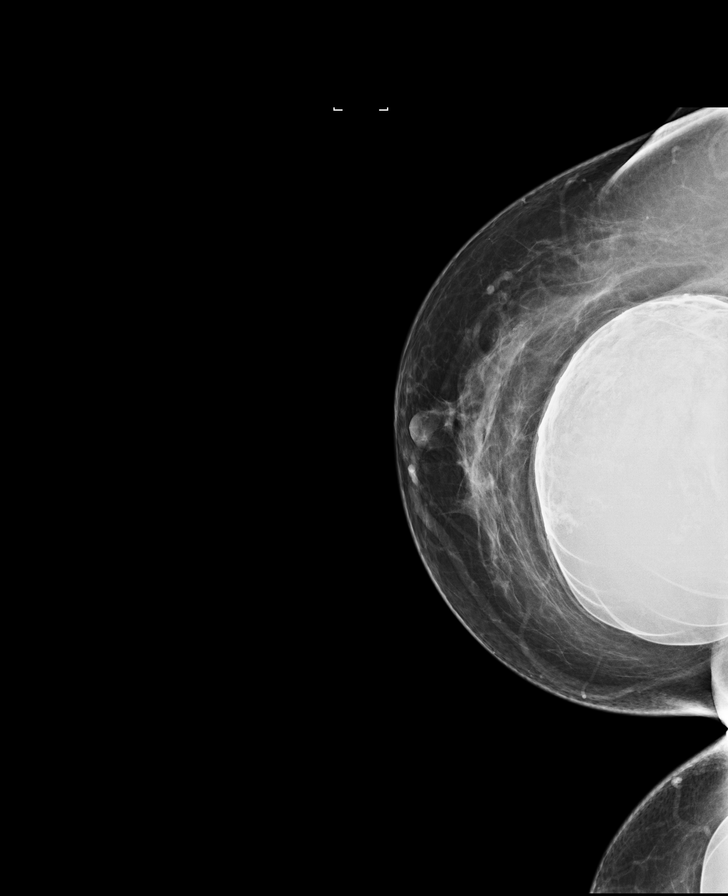

[L MLO]
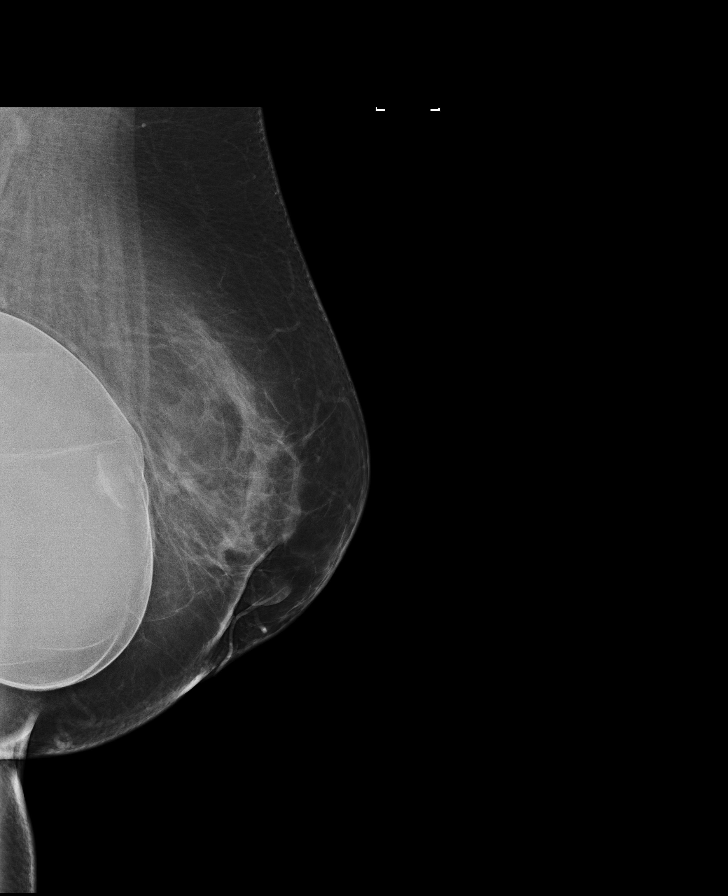

[R MLO]
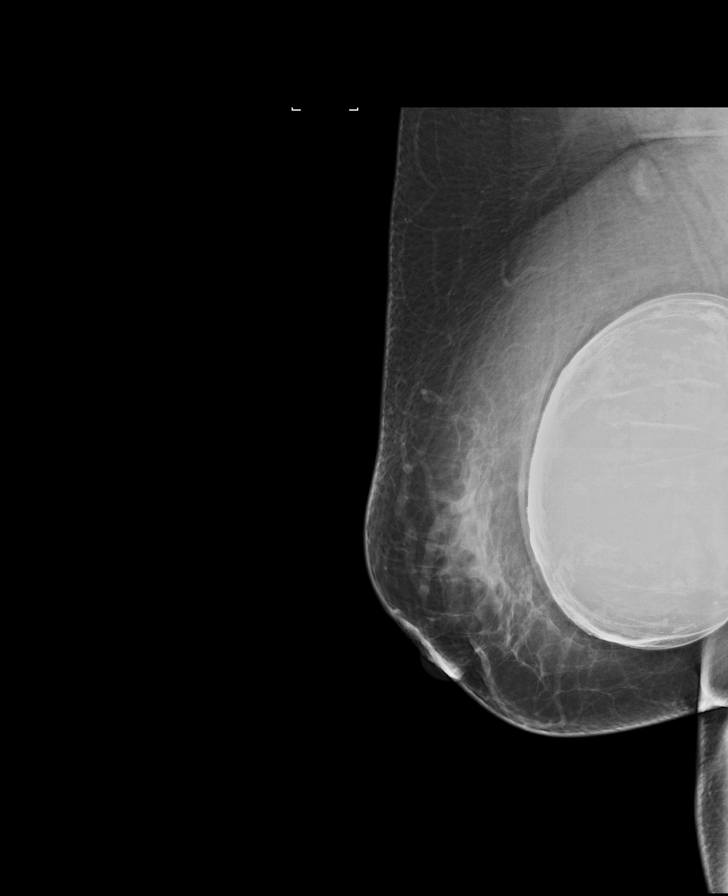

[L CC]
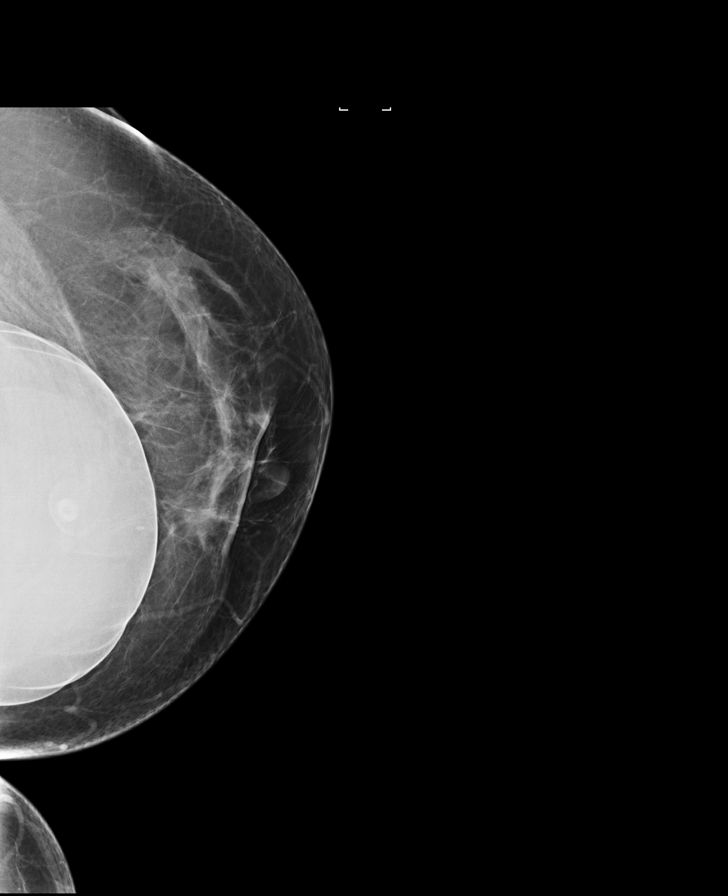

[L MLO synth-2D]
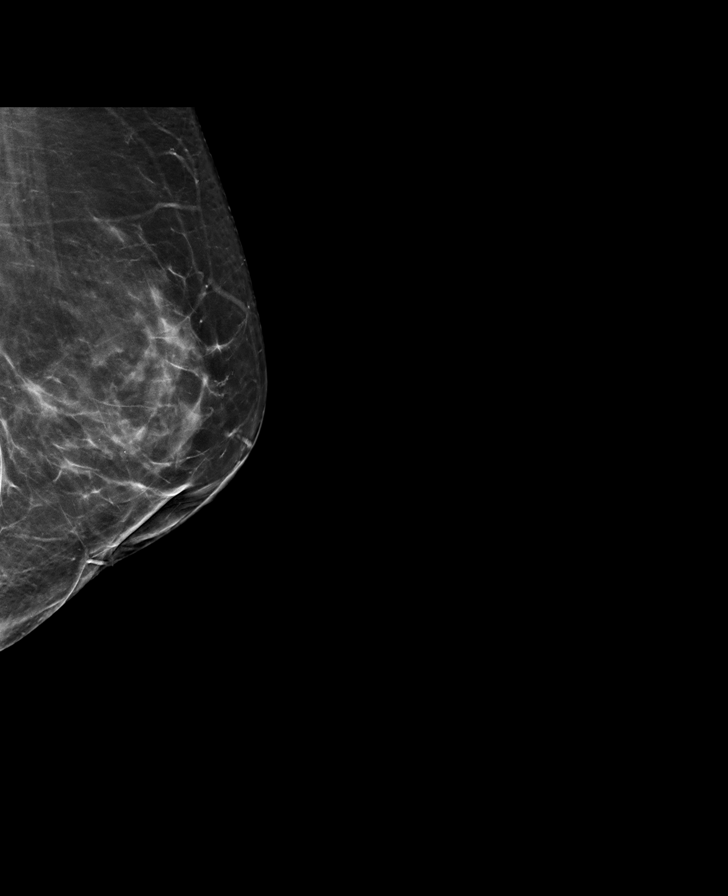

[R MLO synth-2D]
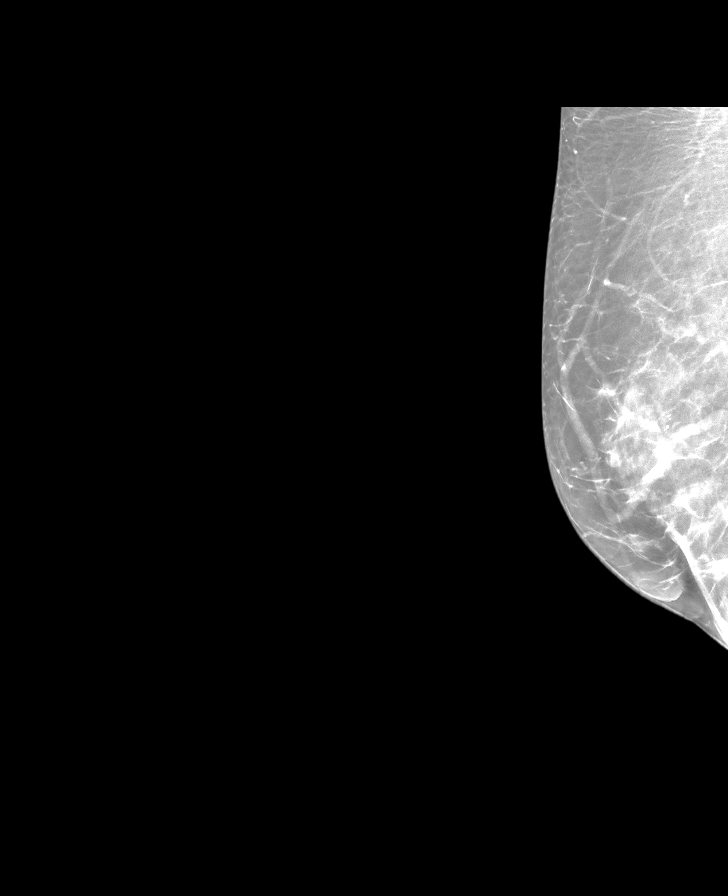

[R CC synth-2D]
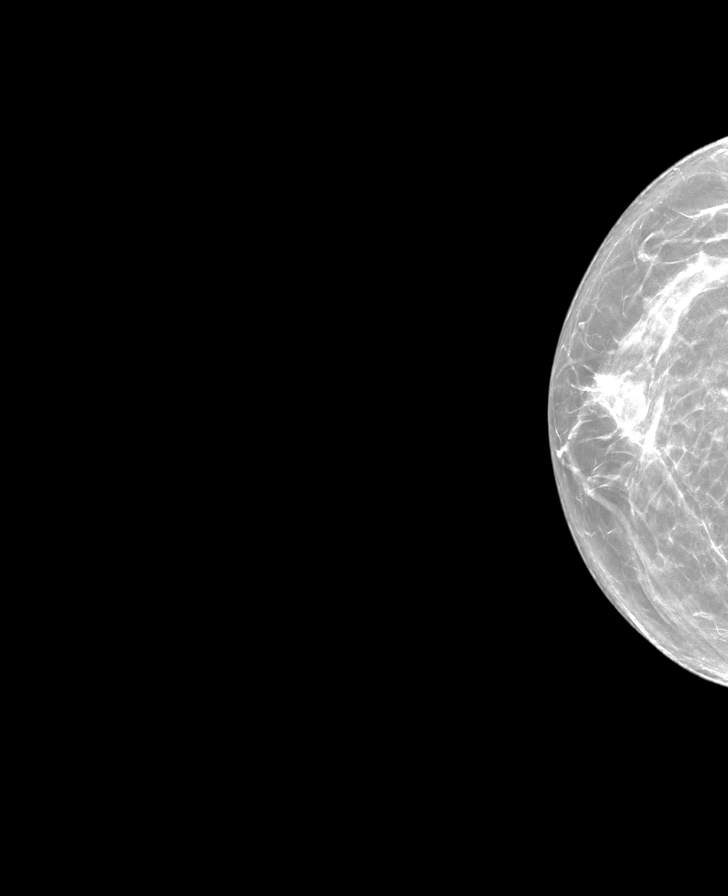

[L CC synth-2D]
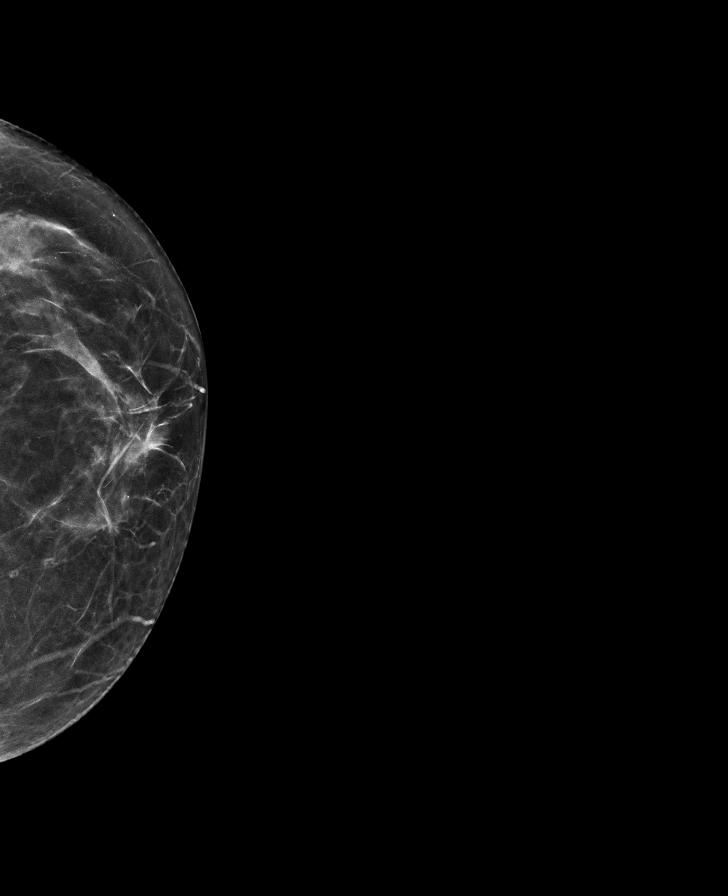

[8 of 28 positions shown; findings below may reference images not displayed]

ACR Breast Density Category b: There are scattered areas of
fibroglandular density.
FINDINGS: There are no findings suspicious for malignancy. Images were
processed with CAD.
IMPRESSION: No mammographic evidence of malignancy. A result letter of this
screening mammogram will be mailed directly to the patient.

RECOMMENDATION:
Screening mammogram in one year. (Code:[id number]-[id number])

BI-RADS CATEGORY  1:  Negative.

## 2020-09-09 ENCOUNTER — Other Ambulatory Visit: Payer: Self-pay

## 2020-09-09 MED ORDER — LEVOTHYROXINE SODIUM 25 MCG PO TABS: 25.0000 ug | ORAL_TABLET | Freq: Every day | ORAL | 3 refills | Status: DC

## 2020-12-05 ENCOUNTER — Other Ambulatory Visit: Payer: Self-pay | Admitting: Obstetrics and Gynecology

## 2020-12-05 DIAGNOSIS — Z1231 Encounter for screening mammogram for malignant neoplasm of breast: Secondary | ICD-10-CM

## 2021-01-03 ENCOUNTER — Ambulatory Visit
Admission: RE | Admit: 2021-01-03 | Discharge: 2021-01-03 | Disposition: A | Discharge status: Home / Self Care | Payer: Commercial Managed Care - PPO | Source: Ambulatory Visit | Attending: Obstetrics and Gynecology | Admitting: Obstetrics and Gynecology

## 2021-01-03 ENCOUNTER — Other Ambulatory Visit: Payer: Self-pay

## 2021-01-03 DIAGNOSIS — Z1231 Encounter for screening mammogram for malignant neoplasm of breast: Secondary | ICD-10-CM

## 2021-01-06 ENCOUNTER — Ambulatory Visit: Payer: Commercial Managed Care - PPO

## 2021-01-26 ENCOUNTER — Ambulatory Visit: Payer: Commercial Managed Care - PPO | Admitting: Family Medicine

## 2021-01-26 ENCOUNTER — Other Ambulatory Visit: Payer: Self-pay

## 2021-01-26 VITALS — BP 134/70 | HR 77 | Temp 98.1°F | Ht 64.0 in | Wt 219.8 lb

## 2021-01-26 DIAGNOSIS — H6990 Unspecified Eustachian tube disorder, unspecified ear: Secondary | ICD-10-CM

## 2021-01-26 DIAGNOSIS — H6980 Other specified disorders of Eustachian tube, unspecified ear: Secondary | ICD-10-CM

## 2021-01-26 MED ORDER — AZELASTINE HCL 0.1 % NA SOLN: 2.0000 | Freq: Two times a day (BID) | NASAL | 12 refills | Status: DC

## 2021-01-26 NOTE — Progress Notes (Signed)
   Terri Jensen is a 55 y.o. female who presents today for an office visit.  Assessment/Plan:  New/Acute Problems: Eustachian Tube dysfunction  Related to her recent flu infection.  No obvious signs of sinus infection today.  We will start treatment conservatively with Astelin.  She will let us know if symptoms worsen or not improving.  May consider referral to ENT or imaging at that time.  Dizziness Likely due to eustachian tube dysfunction.  Her neurologic exam today is reassuring and she has no other red flags on exam.  We will treat as above.  Also encourage good oral hydration.  If symptoms persist will consider labs.  She deferred for today.  Discussed reasons to return to care.     Subjective:  HPI:  Had the flu a couple of weeks ago. Had only mild symptoms. Started about 3 days ago started noticing more dizziness. Described as a room spinning sensation. Tried Sudafed which did not help. Worse with certain motions such as bending over or turning head from side to side.  No reported weakness or numbness.  No headache.  She has had a mild amount of congestion.  No rhinorrhea.  Mild bilateral ear pain.       Objective:  Physical Exam: LMP 09/08/2013   Gen: No acute distress, resting comfortably HEENT: Left TM retracted.  Right TM with clear effusion.  OP erythematous.  Nose mucosa erythematous and boggy. CV: Regular rate and rhythm with no murmurs appreciated Pulm: Normal work of breathing, clear to auscultation bilaterally with no crackles, wheezes, or rhonchi Neuro: Grossly normal, moves all extremities.  Coordination intact.  Dix-Hallpike deferred. Psych: Normal affect and thought content      Sang Blount M. Jerline Pain, MD 01/26/2021 2:19 PM

## 2021-01-27 ENCOUNTER — Other Ambulatory Visit: Payer: Self-pay | Admitting: Family Medicine

## 2021-01-27 DIAGNOSIS — E785 Hyperlipidemia, unspecified: Secondary | ICD-10-CM

## 2021-01-27 DIAGNOSIS — N951 Menopausal and female climacteric states: Secondary | ICD-10-CM

## 2021-03-26 ENCOUNTER — Encounter: Payer: Self-pay | Admitting: Family Medicine

## 2021-03-26 NOTE — Telephone Encounter (Signed)
Please advise 

## 2021-04-21 ENCOUNTER — Encounter: Payer: Self-pay | Admitting: Family Medicine

## 2021-04-21 ENCOUNTER — Other Ambulatory Visit: Payer: Self-pay | Admitting: Family Medicine

## 2021-04-21 ENCOUNTER — Ambulatory Visit (INDEPENDENT_AMBULATORY_CARE_PROVIDER_SITE_OTHER): Payer: Commercial Managed Care - PPO | Admitting: Family Medicine

## 2021-04-21 VITALS — BP 120/86 | HR 71 | Temp 98.1°F | Ht 64.0 in | Wt 219.8 lb

## 2021-04-21 DIAGNOSIS — Z0000 Encounter for general adult medical examination without abnormal findings: Secondary | ICD-10-CM

## 2021-04-21 DIAGNOSIS — G4733 Obstructive sleep apnea (adult) (pediatric): Secondary | ICD-10-CM

## 2021-04-21 DIAGNOSIS — Z23 Encounter for immunization: Secondary | ICD-10-CM | POA: Diagnosis not present

## 2021-04-21 DIAGNOSIS — Z9989 Dependence on other enabling machines and devices: Secondary | ICD-10-CM

## 2021-04-21 DIAGNOSIS — F411 Generalized anxiety disorder: Secondary | ICD-10-CM

## 2021-04-21 DIAGNOSIS — Z8249 Family history of ischemic heart disease and other diseases of the circulatory system: Secondary | ICD-10-CM | POA: Insufficient documentation

## 2021-04-21 DIAGNOSIS — M940 Chondrocostal junction syndrome [Tietze]: Secondary | ICD-10-CM

## 2021-04-21 DIAGNOSIS — E782 Mixed hyperlipidemia: Secondary | ICD-10-CM | POA: Diagnosis not present

## 2021-04-21 DIAGNOSIS — E038 Other specified hypothyroidism: Secondary | ICD-10-CM

## 2021-04-21 LAB — TSH: TSH: 5.31 u[IU]/mL (ref 0.35–5.50)

## 2021-04-21 LAB — COMPREHENSIVE METABOLIC PANEL
ALT: 35 U/L (ref 0–35)
AST: 24 U/L (ref 0–37)
Albumin: 4.9 g/dL (ref 3.5–5.2)
Alkaline Phosphatase: 79 U/L (ref 39–117)
BUN: 15 mg/dL (ref 6–23)
CO2: 28 mEq/L (ref 19–32)
Calcium: 9.7 mg/dL (ref 8.4–10.5)
Chloride: 103 mEq/L (ref 96–112)
Creatinine, Ser: 0.76 mg/dL (ref 0.40–1.20)
GFR: 87.67 mL/min (ref >60.00)
Glucose, Bld: 95 mg/dL (ref 70–99)
Potassium: 4.3 mEq/L (ref 3.5–5.1)
Sodium: 140 mEq/L (ref 135–145)
Total Bilirubin: 0.6 mg/dL (ref 0.2–1.2)
Total Protein: 7.3 g/dL (ref 6.0–8.3)

## 2021-04-21 LAB — CBC WITH DIFFERENTIAL/PLATELET
Basophils Absolute: 0 10*3/uL (ref 0.0–0.1)
Basophils Relative: 0.7 % (ref 0.0–3.0)
Eosinophils Absolute: 0.1 10*3/uL (ref 0.0–0.7)
Eosinophils Relative: 2.4 % (ref 0.0–5.0)
HCT: 41.2 % (ref 36.0–46.0)
Hemoglobin: 14.2 g/dL (ref 12.0–15.0)
Lymphocytes Relative: 36.5 % (ref 12.0–46.0)
Lymphs Abs: 1.4 10*3/uL (ref 0.7–4.0)
MCHC: 34.5 g/dL (ref 30.0–36.0)
MCV: 94.5 fl (ref 78.0–100.0)
Monocytes Absolute: 0.3 10*3/uL (ref 0.1–1.0)
Monocytes Relative: 7.6 % (ref 3.0–12.0)
Neutro Abs: 2.1 10*3/uL (ref 1.4–7.7)
Neutrophils Relative %: 52.8 % (ref 43.0–77.0)
Platelets: 201 10*3/uL (ref 150.0–400.0)
RBC: 4.36 Mil/uL (ref 3.87–5.11)
RDW: 12.5 % (ref 11.5–15.5)
WBC: 3.9 10*3/uL — ABNORMAL LOW (ref 4.0–10.5)

## 2021-04-21 LAB — LIPID PANEL
Cholesterol: 194 mg/dL (ref 0–200)
HDL: 68.7 mg/dL (ref >39.00)
LDL Cholesterol: 102 mg/dL — ABNORMAL HIGH (ref 0–99)
NonHDL: 125.18
Total CHOL/HDL Ratio: 3
Triglycerides: 115 mg/dL (ref 0.0–149.0)
VLDL: 23 mg/dL (ref 0.0–40.0)

## 2021-04-21 LAB — T4, FREE: Free T4: 0.61 ng/dL (ref 0.60–1.60)

## 2021-04-21 NOTE — Patient Instructions (Addendum)
Please return in 12 months for your annual complete physical; please come fasting. Schedule a nurse visit in 2-6 months for your 2nd shingrix vaccination. You received the first today.  ? ?I will release your lab results to you on your MyChart account with further instructions. You may see the results before I do, but when I review them I will send you a message with my report or have my assistant call you if things need to be discussed. Please reply to my message with any questions. Thank you!  ? ?If you have any questions or concerns, please don't hesitate to send me a message via MyChart or call the office at [phone]. Thank you for visiting with [place] today! It's our pleasure caring for you.  ? ?Costochondritis ?Costochondritis is inflammation of the tissue (cartilage) that connects the ribs to the breastbone (sternum). This causes pain in the front of the chest. The pain usually starts slowly and involves more than one rib. ?What are the causes? ?The exact cause of this condition is not always known. It results from stress on the cartilage where your ribs attach to your sternum. The cause of this stress could be: ?Chest injury. ?Exercise or activity, such as lifting. ?Severe coughing. ?What increases the risk? ?You are more likely to develop this condition if you: ?Are female. ?Are 88-54 years old. ?Recently started a new exercise or work activity. ?Have low levels of vitamin D. ?Have a condition that makes you cough frequently. ?What are the signs or symptoms? ?The main symptom of this condition is chest pain. The pain: ?Usually starts gradually and can be sharp or dull. ?Gets worse with deep breathing, coughing, or exercise. ?Gets better with rest. ?May be worse when you press on the affected area of your ribs and sternum. ?How is this diagnosed? ?This condition is diagnosed based on your symptoms, your medical history, and a physical exam. Your health care provider will check for pain when pressing on your  sternum. You may also have tests to rule out other causes of chest pain. These may include: ?A chest X-ray to check for lung problems. ?An ECG (electrocardiogram) to see if you have a heart problem that could be causing the pain. ?An imaging scan to rule out a chest or rib fracture. ?How is this treated? ?This condition usually goes away on its own over time. Your health care provider may prescribe an NSAID, such as ibuprofen, to reduce pain and inflammation. Treatment may also include: ?Resting and avoiding activities that make pain worse. ?Applying heat or ice to the area to reduce pain and inflammation. ?Doing exercises to stretch your chest muscles. ?If these treatments do not help, your health care provider may inject a numbing medicine at the sternum-rib connection to help relieve the pain. ?Follow these instructions at home: ?Managing pain, stiffness, and swelling ?  ?If directed, put ice on the painful area. To do this: ?Put ice in a plastic bag. ?Place a towel between your skin and the bag. ?Leave the ice on for 20 minutes, 2-3 times a day. ?If directed, apply heat to the affected area as often as told by your health care provider. Use the heat source that your health care provider recommends, such as a moist heat pack or a heating pad. ?Place a towel between your skin and the heat source. ?Leave the heat on for 20-30 minutes. ?Remove the heat if your skin turns bright red. This is especially important if you are unable to feel pain,  heat, or cold. You may have a greater risk of getting burned. ?Activity ?Rest as told by your health care provider. Avoid activities that make pain worse. This includes any activities that use chest, abdominal, and side muscles. ?Do not lift anything that is heavier than 10 lb (4.5 kg), or the limit that you are told, until your health care provider says that it is safe. ?Return to your normal activities as told by your health care provider. Ask your health care provider what  activities are safe for you. ?General instructions ?Take over-the-counter and prescription medicines only as told by your health care provider. ?Keep all follow-up visits as told by your health care provider. This is important. ?Contact a health care provider if: ?You have chills or a fever. ?Your pain does not go away or it gets worse. ?You have a cough that does not go away. ?Get help right away if: ?You have shortness of breath. ?You have severe chest pain that is not relieved by medicines, heat, or ice. ?These symptoms may represent a serious problem that is an emergency. Do not wait to see if the symptoms will go away. Get medical help right away. Call your local emergency services (911 in the [place] Do not drive yourself to the hospital.  ?Summary ?Costochondritis is inflammation of the tissue (cartilage) that connects the ribs to the breastbone (sternum). ?This condition causes pain in the front of the chest. ?Costochondritis results from stress on the cartilage where your ribs attach to your sternum. ?Treatment may include medicines, rest, heat or ice, and exercises. ?This information is not intended to replace advice given to you by your health care provider. Make sure you discuss any questions you have with your health care provider. ?Document Revised: 12/15/2018 Document Reviewed: 12/15/2018 ?Elsevier Patient Education ? [place]. ? ?

## 2021-04-21 NOTE — Progress Notes (Signed)
Subjective  Chief Complaint  Patient presents with   Annual Exam   Immunizations    1st Shingles    HPI: Terri Jensen is a 56 y.o. female who presents to [facility] [place] today for a Female Wellness Visit. She also has the concerns and/or needs as listed above in the chief complaint. These will be addressed in addition to the Health Maintenance Visit.   Wellness Visit: annual visit with health maintenance review and exam without Pap  Health maintenance: Sees Dr. Sabra Heck for GYN female wellness.  All up-to-date.  On vaginal estrogen therapy for vaginal dryness.  Doing well overall.  Had breast implants removed last summer.  All screens are current and up-to-date.  Eligible for Shingrix vaccinations today. Chronic disease f/u and/or acute problem visit: (deemed necessary to be done in addition to the wellness visit): Sleep apnea now on CPAP and noticing a big improvement in her sleep and less fatigue. Mixed hyperlipidemia with strong family history of premature CAD without chest pain: On Crestor and fasting today for recheck.  Tolerates well.  Complains of left sided chest pain: Daily lasting about 20 to 30 minutes, nonexertional ongoing for the last 6 to 7 months.  Thought may be related to breast implants.  Is improving since surgery however still present.  Mild shoulder pain intermittently but mostly pain is nonexertional and not related to movement.  No GERD symptoms.  No nausea, diaphoresis or shortness of breath.  Pain is mild and she does not take medications for it.  It is improving General anxiety disorder doing very well on Prozac daily.  Assessment  1. Annual physical exam   2. Subclinical hypothyroidism   3. GAD (generalized anxiety disorder)   4. Mixed hyperlipidemia   5. Need for shingles vaccine   6. Family history of premature CAD   7. Obstructive sleep apnea on CPAP   8. Costochondritis      Plan  Female Wellness Visit: Age appropriate  Health Maintenance and Prevention measures were discussed with patient. Included topics are cancer screening recommendations, ways to keep healthy (see AVS) including dietary and exercise recommendations, regular eye and dental care, use of seat belts, and avoidance of moderate alcohol use and tobacco use.  BMI: discussed patient's BMI and encouraged positive lifestyle modifications to help get to or maintain a target BMI. HM needs and immunizations were addressed and ordered. See below for orders. See HM and immunization section for updates. Routine labs and screening tests ordered including cmp, cbc and lipids where appropriate. Discussed recommendations regarding Vit D and calcium supplementation (see AVS)  Chronic disease management visit and/or acute problem visit: Subclinical hypothyroidism: On low-dose thyroid 25 mcg daily recheck today.  Says she feels better on this lower dose.  We will push TSH to less than 2. General anxiety disorder: Continue Prozac daily. Next hyperlipidemia on Crestor 5 nightly.  Recheck fasting levels today.  Has history of elevated lipids from lipid clinic.  Primary prevention for CAD given risk factors of hyperlipidemia and strong family history discussed reviewed normal EKG from 2019. Atypical chest pain is most consistent with costochondritis on exam.  Reassured. Continue CPAP for sleep apnea  Follow up: 12 months for your complete annual physical exam with blood work. Please come fasting. Orders Placed This Encounter  Procedures   Varicella-zoster vaccine IM (Shingrix)   CBC with Differential/Platelet   Comprehensive metabolic panel   Lipid panel   TSH   T3  T4, free   No orders of the defined types were placed in this encounter.     Body mass index is 37.73 kg/m. Wt Readings from Last 3 Encounters:  04/21/21 219 lb 12.8 oz (99.7 kg)  01/26/21 219 lb 12.8 oz (99.7 kg)  07/23/20 206 lb (93.4 kg)     Patient Active Problem List   Diagnosis  Date Noted   Family history of premature CAD 04/21/2021    Priority: High    Dad 59; mom 16s; + HTN, +HLD and smokers    Obstructive sleep apnea on CPAP 04/21/2021    Priority: High   GAD (generalized anxiety disorder) 03/16/2019    Priority: High   Subclinical hypothyroidism 08/05/2016    Priority: High   Obesity (BMI 35.0-39.9 without comorbidity) 04/05/2016    Priority: High   Mixed hyperlipidemia 04/05/2016    Priority: High   Elevated LFTs 03/28/2020    Priority: Medium    Postmenopausal HRT (hormone replacement therapy) 03/16/2019    Priority: Medium    Lichen sclerosus of female genitalia 12/20/2018    Priority: Medium    Lumbar radiculopathy 02/21/2018    Priority: Medium    Inflammatory arthritis 01/03/2018    Priority: Medium    Kidney stones     Priority: Medium    Low vitamin D level 04/05/2016    Priority: Low   Health Maintenance  Topic Date Due   Zoster Vaccines- Shingrix (1 of 2) Never done   COVID-19 Vaccine (6 - Booster) 05/07/2021 (Originally 03/20/2020)   MAMMOGRAM  01/03/2022   PAP SMEAR-Modifier  12/20/2023   COLONOSCOPY (Pts 45-47yr Insurance coverage will need to be confirmed)  02/17/2026   TETANUS/TDAP  08/05/2026   INFLUENZA VACCINE  Completed   Hepatitis C Screening  Completed   HPV VACCINES  Aged Out   HIV Screening  Discontinued   Immunization History  Administered Date(s) Administered   Influenza,inj,Quad PF,6+ Mos 11/09/2016   Influenza,inj,quad, With Preservative 10/16/2016   Influenza-Unspecified 02/15/2013, 11/15/2016, 11/15/2017, 11/16/2019, 11/14/2020   PFIZER(Purple Top)SARS-COV-2 Vaccination 04/14/2019, 05/05/2019, 01/24/2020   Tdap 08/04/2016   Unspecified SARS-COV-2 Vaccination 04/16/2019, 05/17/2019   We updated and reviewed the patient's past history in detail and it is documented below. Allergies: Patient is allergic to shellfish allergy, tape, iodine, sulfamethoxazole-trimethoprim, codeine, and sulfa antibiotics. Past  Medical History Patient  has a past medical history of Anxiety, History of kidney stones, History of shingles, History of varicella, HLD (hyperlipidemia), Hypothyroidism, Low vitamin D level, Perimenopause, PONV (postoperative nausea and vomiting), Postmenopausal HRT (hormone replacement therapy) (03/16/2019), and Prediabetes. Past Surgical History Patient  has a past surgical history that includes Novasure ablation (2005); Cholecystectomy; Wisdom tooth extraction; Cesarean section; Radioactive seed guided excisional breast biopsy (Left, 05/04/2017); Augmentation mammaplasty (Bilateral); and Breast excisional biopsy (Left, 2019). Family History: Patient family history includes Breast cancer in her mother; COPD in her father and mother; Cancer (age of onset: 779 in her paternal grandmother; Colon cancer in her paternal grandmother; Diabetes in her father and mother; Heart disease in her father and mother; Hypertension in her brother, father, and mother; Kidney disease in her father; Liver disease in her father; Stroke in her mother; Thyroid disease in her mother. Social History:  Patient  reports that she quit smoking about 31 years ago. Her smoking use included cigarettes. She has a 7.00 pack-year smoking history. She has never used smokeless tobacco. She reports current alcohol use of about 6.0 - 8.0 standard drinks per week. She reports that she  does not use drugs.  Review of Systems: Constitutional: negative for fever or malaise Ophthalmic: negative for photophobia, double vision or loss of vision Cardiovascular: negative for chest pain, dyspnea on exertion, or new LE swelling Respiratory: negative for SOB or persistent cough Gastrointestinal: negative for abdominal pain, change in bowel habits or melena Genitourinary: negative for dysuria or gross hematuria, no abnormal uterine bleeding or disharge Musculoskeletal: negative for new gait disturbance or muscular weakness Integumentary: negative for  new or persistent rashes, no breast lumps Neurological: negative for TIA or stroke symptoms Psychiatric: negative for SI or delusions Allergic/Immunologic: negative for hives  Patient Care Team    Relationship Specialty Notifications Start End  Leamon Arnt, MD PCP - General Family Medicine  03/16/19   Juanita Craver, MD Consulting Physician Gastroenterology  01/03/18   Devra Dopp, MD Referring Physician Dermatology  01/03/18   Loleta Books, MD Consulting Physician Ophthalmology  01/03/18   Delsa Bern, MD Consulting Physician Obstetrics and Gynecology  01/03/18   Lahoma Rocker, MD Consulting Physician Rheumatology  08/06/18     Objective  Vitals: BP 120/86    Pulse 71    Temp 98.1 F (36.7 C) (Temporal)    Ht '5\' 4"'$  (1.626 m)    Wt 219 lb 12.8 oz (99.7 kg)    LMP 09/08/2013    SpO2 98%    BMI 37.73 kg/m  General:  Well developed, well nourished, no acute distress  Psych:  Alert and orientedx3,normal mood and affect HEENT:  Normocephalic, atraumatic, non-icteric sclera,  supple neck without adenopathy, mass or thyromegaly Cardiovascular:  Normal S1, S2, RRR without gallop, rub or murmur, left-sided costochondral junction tenderness reproduces pain. Right Respiratory:  Good breath sounds bilaterally, CTAB with normal respiratory effort Gastrointestinal: normal bowel sounds, soft, non-tender, no noted masses. No HSM MSK: no deformities, contusions. Joints are without erythema or swelling.  Skin:  Warm, no rashes or suspicious lesions noted Neurologic:    Mental status is normal. CN 2-11 are normal. Gross motor and sensory exams are normal. Normal gait. No tremor  Commons side effects, risks, benefits, and alternatives for medications and treatment plan prescribed today were discussed, and the patient expressed understanding of the given instructions. Patient is instructed to call or message via MyChart if he/she has any questions or concerns regarding our treatment  plan. No barriers to understanding were identified. We discussed Red Flag symptoms and signs in detail. Patient expressed understanding regarding what to do in case of urgent or emergency type symptoms.  Medication list was reconciled, printed and provided to the patient in AVS. Patient instructions and summary information was reviewed with the patient as documented in the AVS. This note was prepared with assistance of Dragon voice recognition software. Occasional wrong-word or sound-a-like substitutions may have occurred due to the inherent limitations of voice recognition software  This visit occurred during the SARS-CoV-2 public health emergency.  Safety protocols were in place, including screening questions prior to the visit, additional usage of staff PPE, and extensive cleaning of exam room while observing appropriate contact time as indicated for disinfecting solutions.

## 2021-04-22 LAB — T3: T3, Total: 104 ng/dL (ref 76–181)

## 2021-04-22 MED ORDER — EPINEPHRINE 0.3 MG/0.3ML IJ SOAJ: 0.3000 mg | INTRAMUSCULAR | 2 refills | Status: DC | PRN

## 2021-04-22 NOTE — Telephone Encounter (Signed)
Last refill by historical provider  

## 2021-04-23 ENCOUNTER — Other Ambulatory Visit: Payer: Self-pay

## 2021-04-24 ENCOUNTER — Telehealth: Payer: Self-pay | Admitting: Family Medicine

## 2021-04-24 ENCOUNTER — Other Ambulatory Visit: Payer: Self-pay

## 2021-04-24 MED ORDER — ROSUVASTATIN CALCIUM 10 MG PO TABS: 10.0000 mg | ORAL_TABLET | Freq: Every day | ORAL | 3 refills | Status: DC

## 2021-04-24 MED ORDER — LEVOTHYROXINE SODIUM 50 MCG PO TABS: 50.0000 ug | ORAL_TABLET | Freq: Every day | ORAL | 3 refills | Status: DC

## 2021-04-24 NOTE — Telephone Encounter (Signed)
Please see lab result note.

## 2021-04-24 NOTE — Telephone Encounter (Signed)
Pt would like a call regarding labs and any changes.  ?

## 2021-07-28 ENCOUNTER — Ambulatory Visit (INDEPENDENT_AMBULATORY_CARE_PROVIDER_SITE_OTHER): Payer: Commercial Managed Care - PPO

## 2021-07-28 DIAGNOSIS — Z23 Encounter for immunization: Secondary | ICD-10-CM | POA: Diagnosis not present

## 2021-07-30 ENCOUNTER — Telehealth: Payer: Self-pay | Admitting: Family Medicine

## 2021-07-30 NOTE — Telephone Encounter (Signed)
..  Type of form received: Physician Health Screening form   Additional comments:   Received by: Havlyn Ratchford  Form should be Faxed to: [phone]  Form should be mailed to:    Is patient requesting call for pickup: No   Form placed:  In provider's box  Attach charge sheet.   Individual made aware of 3-5 business day turn around (Y/N)? Yes

## 2021-08-03 NOTE — Telephone Encounter (Signed)
Forms are on Andy's desk

## 2021-09-15 ENCOUNTER — Other Ambulatory Visit (HOSPITAL_BASED_OUTPATIENT_CLINIC_OR_DEPARTMENT_OTHER): Payer: Self-pay

## 2021-09-15 MED ORDER — WEGOVY 0.25 MG/0.5ML ~~LOC~~ SOAJ
SUBCUTANEOUS | 0 refills | Status: DC
Start: 2021-09-15 — End: 2021-10-22
  Filled 2021-09-15 – 2021-10-15 (×3): qty 2, 28d supply, fill #0

## 2021-09-16 ENCOUNTER — Other Ambulatory Visit (HOSPITAL_BASED_OUTPATIENT_CLINIC_OR_DEPARTMENT_OTHER): Payer: Self-pay

## 2021-09-17 ENCOUNTER — Other Ambulatory Visit (HOSPITAL_BASED_OUTPATIENT_CLINIC_OR_DEPARTMENT_OTHER): Payer: Self-pay

## 2021-09-17 ENCOUNTER — Encounter (HOSPITAL_BASED_OUTPATIENT_CLINIC_OR_DEPARTMENT_OTHER): Payer: Self-pay

## 2021-10-02 ENCOUNTER — Ambulatory Visit: Payer: Commercial Managed Care - PPO | Admitting: Nurse Practitioner

## 2021-10-02 ENCOUNTER — Encounter: Payer: Self-pay | Admitting: Nurse Practitioner

## 2021-10-02 DIAGNOSIS — G4733 Obstructive sleep apnea (adult) (pediatric): Secondary | ICD-10-CM

## 2021-10-02 DIAGNOSIS — Z9989 Dependence on other enabling machines and devices: Secondary | ICD-10-CM

## 2021-10-02 DIAGNOSIS — E669 Obesity, unspecified: Secondary | ICD-10-CM | POA: Diagnosis not present

## 2021-10-02 NOTE — Assessment & Plan Note (Signed)
Healthy weight management advised.

## 2021-10-02 NOTE — Assessment & Plan Note (Signed)
Excellent compliance and has received good benefit. Pressures appear to be set appropriately. Residual AHI 1.8. No changes made today.   Patient Instructions  Continue to use CPAP every night, minimum of 4-6 hours a night.  Change equipment every 30 days or as directed by DME. Wash your tubing with warm soap and water daily, hang to dry. Wash humidifier portion weekly.  Be aware of reduced alertness and do not drive or operate heavy machinery if experiencing this or drowsiness.  Exercise encouraged, as tolerated. Avoid or decrease alcohol consumption and medications that make you more sleepy, if possible. Notify if persistent daytime sleepiness occurs even with consistent use of CPAP.  Follow up in one year with Dr. Ander Slade. If symptoms do not improve or worsen, please contact office for sooner follow up or seek emergency care.

## 2021-10-02 NOTE — Patient Instructions (Signed)
Continue to use CPAP every night, minimum of 4-6 hours a night.  Change equipment every 30 days or as directed by DME. Wash your tubing with warm soap and water daily, hang to dry. Wash humidifier portion weekly.  Be aware of reduced alertness and do not drive or operate heavy machinery if experiencing this or drowsiness.  Exercise encouraged, as tolerated. Avoid or decrease alcohol consumption and medications that make you more sleepy, if possible. Notify if persistent daytime sleepiness occurs even with consistent use of CPAP.  Follow up in one year with Dr. Ander Slade. If symptoms do not improve or worsen, please contact office for sooner follow up or seek emergency care.

## 2021-10-02 NOTE — Progress Notes (Signed)
cp  '@Patient'$  ID: Terri Jensen, female    DOB: 10-21-1965, 56 y.o.   MRN: [id number]  Chief Complaint  Patient presents with   Follow-up    Cpap compliance    Referring provider: Leamon Arnt, MD  HPI: 56 year old female, former remote smoker followed for OSA. She is a patient of Dr. Judson Roch and last seen in office 06/24/2020. Past medical history significant for hypothyroid, inflammatory arthritis, obesity, HLD, anxiety.   TEST/EVENTS:  07/20/2020 HST: AHI 13.7/h, SpO2 low 85%  06/24/2021: OV with Dr. Ander Slade.  She has had problems with poor sleep, snoring and multiple awakenings.  She had a sleep study done about 5 years ago that showed mild to moderate sleep apnea.  Watchful waiting was recommended at that time.  Symptoms a little more prominent recently.  She also has daytime sleepiness.  Recommended repeat HST for further evaluation.  10/02/2021: Today-follow-up Patient presents today for overdue follow-up.  After she was seen last, she completed home sleep study which showed mild to moderate obstructive sleep apnea with AHI 13.7.  She was started on auto CPAP 5-15.  Today, she reports that she has been doing well on her CPAP.  Wears it almost every night.  She gets significant benefit from use.  No longer feels as fatigued as she did.  Wakes in the morning feeling like she rested well.  She also has not been snoring.  Denies any morning headaches or drowsy driving.  Has not had any issues with her CPAP.  Wears a nasal pillow mask.  09/01/2021-09/30/2021 Airview download CPAP 5-15 cmH2O 25/30 days; 80% >4 hours; average usage 7 hours 37 minutes Pressure median 8.4, 95th percentile 11 Leaks median 1.2, 95th 17.5 AHI 1.8  Allergies  Allergen Reactions   Shellfish Allergy Anaphylaxis   Tape Rash   Iodine    Sulfamethoxazole-Trimethoprim Other (See Comments)   Codeine Nausea And Vomiting   Sulfa Antibiotics Rash    As a young child    Immunization History  Administered  Date(s) Administered   Influenza,inj,Quad PF,6+ Mos 11/09/2016   Influenza,inj,quad, With Preservative 10/16/2016   Influenza-Unspecified 02/15/2013, 11/15/2016, 11/15/2017, 11/16/2019, 11/14/2020   PFIZER(Purple Top)SARS-COV-2 Vaccination 04/14/2019, 05/05/2019, 01/24/2020   Tdap 08/04/2016   Unspecified SARS-COV-2 Vaccination 04/16/2019, 05/17/2019   Zoster Recombinat (Shingrix) 04/21/2021, 07/28/2021    Past Medical History:  Diagnosis Date   Anxiety    History of kidney stones    History of shingles    History of varicella    HLD (hyperlipidemia)    Hypothyroidism    Low vitamin D level    Perimenopause    PONV (postoperative nausea and vomiting)    Postmenopausal HRT (hormone replacement therapy) 03/16/2019   Prediabetes     Tobacco History: Social History   Tobacco Use  Smoking Status Former   Packs/day: 1.00   Years: 7.00   Total pack years: 7.00   Types: Cigarettes   Quit date: 04/23/1990   Years since quitting: 31.4  Smokeless Tobacco Never   Counseling given: Not Answered   Outpatient Medications Prior to Visit  Medication Sig Dispense Refill   [employer] SUNSCREEN SPF 30 EX apply     EPINEPHrine 0.3 mg/0.3 mL IJ SOAJ injection Inject 0.3 mg into the muscle as needed for anaphylaxis. 1 each 2   FLUoxetine (PROZAC) 20 MG capsule TAKE 2 CAPSULES BY MOUTH  DAILY 180 capsule 3   levothyroxine (SYNTHROID) 50 MCG tablet Take 1 tablet (50 mcg total) by mouth daily  before breakfast. 90 tablet 3   Multiple Vitamins-Minerals (MULTIVITAMIN ADULT) CHEW Chew 2 each by mouth daily.     rosuvastatin (CRESTOR) 10 MG tablet Take 1 tablet (10 mg total) by mouth daily. 90 tablet 3   tretinoin (RETIN-A) 0.1 % cream APPLY TO AFFECTED AREA EVERY DAY     triamcinolone ointment (KENALOG) 0.1 % SMARTSIG:Sparingly Topical Twice a Week     Vitamin D, Cholecalciferol, 50 MCG (2000 UT) CAPS Take by mouth.     YUVAFEM 10 MCG TABS vaginal tablet Place 1 tablet vaginally 2 (two) times a week.      azelastine (ASTELIN) 0.1 % nasal spray Place 2 sprays into both nostrils 2 (two) times daily. (Patient not taking: Reported on 04/21/2021) 30 mL 12   doxycycline (MONODOX) 50 MG capsule Take 50 mg by mouth daily.     Semaglutide-Weight Management (WEGOVY) 0.25 MG/0.5ML SOAJ 0.'25MG'$  Subcutaneous weekly 30 day(s) (Patient not taking: Reported on 10/02/2021) 2 mL 0   No facility-administered medications prior to visit.     Review of Systems:   Constitutional: No weight loss or gain, night sweats, fevers, chills, fatigue, or lassitude. HEENT: No headaches, difficulty swallowing, tooth/dental problems, or sore throat. No sneezing, itching, ear ache, nasal congestion, or post nasal drip CV:  No chest pain, orthopnea, PND, swelling in lower extremities, anasarca, dizziness, palpitations, syncope Resp: No shortness of breath with exertion or at rest. No excess mucus or change in color of mucus. No productive or non-productive. No hemoptysis. No wheezing.  No chest wall deformity GU: No dysuria, change in color of urine, urgency or frequency.  No flank pain, no hematuria  Neuro: No dizziness or lightheadedness.  Psych: No depression or anxiety. Mood stable.     Physical Exam:  BP 128/70 (BP Location: Left Arm, Cuff Size: Large)   Pulse 96   Temp 98.3 F (36.8 C) (Oral)   Ht '5\' 4"'$  (1.626 m)   Wt 226 lb (102.5 kg)   LMP 09/08/2013   SpO2 95%   BMI 38.79 kg/m   GEN: Pleasant, interactive, well-appearing; obese; in no acute distress. HEENT:  Normocephalic and atraumatic. PERRLA. Sclera white. Nasal turbinates pink, moist and patent bilaterally. No rhinorrhea present. Oropharynx pink and moist, without exudate or edema. No lesions, ulcerations, or postnasal drip.  NECK:  Supple w/ fair ROM.  CV: RRR, no m/r/g, no peripheral edema. Pulses intact, +2 bilaterally. No cyanosis, pallor or clubbing. PULMONARY:  Unlabored, regular breathing. Clear bilaterally A&P w/o wheezes/rales/rhonchi. No  accessory muscle use. No dullness to percussion. GI: BS present and normoactive. Soft, non-tender to palpation. No organomegaly or masses detected. No CVA tenderness. MSK: No erythema, warmth or tenderness. Cap refil <2 sec all extrem. No deformities or joint swelling noted.  Neuro: A/Ox3. No focal deficits noted.   Skin: Warm, no lesions or rashe Psych: Normal affect and behavior. Judgement and thought content appropriate.     Lab Results:  CBC    Component Value Date/Time   WBC 3.9 (L) 04/21/2021 1158   RBC 4.36 04/21/2021 1158   HGB 14.2 04/21/2021 1158   HCT 41.2 04/21/2021 1158   PLT 201.0 04/21/2021 1158   MCV 94.5 04/21/2021 1158   MCH 31.6 05/02/2017 1521   MCHC 34.5 04/21/2021 1158   RDW 12.5 04/21/2021 1158   LYMPHSABS 1.4 04/21/2021 1158   MONOABS 0.3 04/21/2021 1158   EOSABS 0.1 04/21/2021 1158   BASOSABS 0.0 04/21/2021 1158    BMET    Component Value Date/Time  NA 140 04/21/2021 1158   NA 141 07/17/2018 0000   K 4.3 04/21/2021 1158   CL 103 04/21/2021 1158   CO2 28 04/21/2021 1158   GLUCOSE 95 04/21/2021 1158   BUN 15 04/21/2021 1158   BUN 18 07/17/2018 0000   CREATININE 0.76 04/21/2021 1158   CALCIUM 9.7 04/21/2021 1158   GFRNONAA >60 05/02/2017 1521   GFRAA >60 05/02/2017 1521    BNP No results found for: "BNP"   Imaging:  No results found.        No data to display          No results found for: "NITRICOXIDE"      Assessment & Plan:   Obstructive sleep apnea on CPAP Excellent compliance and has received good benefit. Pressures appear to be set appropriately. Residual AHI 1.8. No changes made today.   Patient Instructions  Continue to use CPAP every night, minimum of 4-6 hours a night.  Change equipment every 30 days or as directed by DME. Wash your tubing with warm soap and water daily, hang to dry. Wash humidifier portion weekly.  Be aware of reduced alertness and do not drive or operate heavy machinery if experiencing  this or drowsiness.  Exercise encouraged, as tolerated. Avoid or decrease alcohol consumption and medications that make you more sleepy, if possible. Notify if persistent daytime sleepiness occurs even with consistent use of CPAP.  Follow up in one year with Dr. Ander Slade. If symptoms do not improve or worsen, please contact office for sooner follow up or seek emergency care.     Obesity (BMI 35.0-39.9 without comorbidity) Healthy weight management advised.    I spent 25 minutes of dedicated to the care of this patient on the date of this encounter to include pre-visit review of records, face-to-face time with the patient discussing conditions above, post visit ordering of testing, clinical documentation with the electronic health record, making appropriate referrals as documented, and communicating necessary findings to members of the patients care team.  Clayton Bibles, NP 10/02/2021  Pt aware and understands NP's role.

## 2021-10-15 ENCOUNTER — Other Ambulatory Visit (HOSPITAL_BASED_OUTPATIENT_CLINIC_OR_DEPARTMENT_OTHER): Payer: Self-pay

## 2021-10-22 ENCOUNTER — Encounter (HOSPITAL_BASED_OUTPATIENT_CLINIC_OR_DEPARTMENT_OTHER): Payer: Self-pay | Admitting: Cardiology

## 2021-10-22 ENCOUNTER — Ambulatory Visit (HOSPITAL_BASED_OUTPATIENT_CLINIC_OR_DEPARTMENT_OTHER): Payer: Commercial Managed Care - PPO | Admitting: Cardiology

## 2021-10-22 VITALS — BP 128/88 | HR 74 | Ht 64.0 in | Wt 225.5 lb

## 2021-10-22 DIAGNOSIS — Z7182 Exercise counseling: Secondary | ICD-10-CM

## 2021-10-22 DIAGNOSIS — Z8249 Family history of ischemic heart disease and other diseases of the circulatory system: Secondary | ICD-10-CM | POA: Diagnosis not present

## 2021-10-22 DIAGNOSIS — G4733 Obstructive sleep apnea (adult) (pediatric): Secondary | ICD-10-CM | POA: Diagnosis not present

## 2021-10-22 DIAGNOSIS — E669 Obesity, unspecified: Secondary | ICD-10-CM

## 2021-10-22 DIAGNOSIS — Z7189 Other specified counseling: Secondary | ICD-10-CM

## 2021-10-22 DIAGNOSIS — Z713 Dietary counseling and surveillance: Secondary | ICD-10-CM

## 2021-10-22 DIAGNOSIS — E782 Mixed hyperlipidemia: Secondary | ICD-10-CM

## 2021-10-22 NOTE — Patient Instructions (Signed)
Medication Instructions:  Your Physician recommend you continue on your current medication as directed.    *If you need a refill on your cardiac medications before your next appointment, please call your pharmacy*   Lab Work: Your provider has recommended lab work today ([facility]Pa). Please have this collected at [facility] at [place]. The lab is open 8:00 am - 4:30 pm. Please avoid 12:00p - 1:00p for lunch hour. You do not need an appointment. Please go to [address] [place], [place] [address]. This is in the Primary Care office on the 3rd floor, let them know you are there for blood work and they will direct you to the lab.  If you have labs (blood work) drawn today and your tests are completely normal, you will receive your results only by: [place] (if you have MyChart) OR A paper copy in the mail If you have any lab test that is abnormal or we need to change your treatment, we will call you to review the results.   Testing/Procedures: CT coronary calcium score.   Test locations:  [place]   This is $99 out of pocket.   Coronary CalciumScan A coronary calcium scan is an imaging test used to look for deposits of calcium and other fatty materials (plaques) in the inner lining of the blood vessels of the heart (coronary arteries). These deposits of calcium and plaques can partly clog and narrow the coronary arteries without producing any symptoms or warning signs. This puts a person at risk for a heart attack. This test can detect these deposits before symptoms develop. Tell a health care provider about: Any allergies you have. All medicines you are taking, including vitamins, herbs, eye drops, creams, and over-the-counter medicines. Any problems you or family members have had with anesthetic medicines. Any blood disorders you have. Any surgeries you have had. Any medical conditions you have. Whether you are pregnant or may be  pregnant. What are the risks? Generally, this is a safe procedure. However, problems may occur, including: Harm to a pregnant woman and her unborn baby. This test involves the use of radiation. Radiation exposure can be dangerous to a pregnant woman and her unborn baby. If you are pregnant, you generally should not have this procedure done. Slight increase in the risk of cancer. This is because of the radiation involved in the test. What happens before the procedure? No preparation is needed for this procedure. What happens during the procedure? You will undress and remove any jewelry around your neck or chest. You will put on a hospital gown. Sticky electrodes will be placed on your chest. The electrodes will be connected to an electrocardiogram (ECG) machine to record a tracing of the electrical activity of your heart. A CT scanner will take pictures of your heart. During this time, you will be asked to lie still and hold your breath for 2-3 seconds while a picture of your heart is being taken. The procedure may vary among health care providers and hospitals. What happens after the procedure? You can get dressed. You can return to your normal activities. It is up to you to get the results of your test. Ask your health care provider, or the department that is doing the test, when your results will be ready. Summary A coronary calcium scan is an imaging test used to look for deposits of calcium and other fatty materials (plaques) in the inner lining of the blood vessels of the heart (coronary arteries). Generally,  this is a safe procedure. Tell your health care provider if you are pregnant or may be pregnant. No preparation is needed for this procedure. A CT scanner will take pictures of your heart. You can return to your normal activities after the scan is done. This information is not intended to replace advice given to you by your health care provider. Make sure you discuss any questions  you have with your health care provider. Document Released: 07/31/2007 Document Revised: 12/22/2015 Document Reviewed: 12/22/2015 Elsevier Interactive Patient Education  2017 Ken C[place]: At [facility], you and your health needs are our priority.  As part of our continuing mission to provide you with exceptional heart care, we have created designated Provider Care Teams.  These Care Teams include your primary Cardiologist (physician) and Advanced Practice Providers (APPs -  Physician Assistants and Nurse Practitioners) who all work together to provide you with the care you need, when you need it.  We recommend signing up for the patient portal called "MyChart".  Sign up information is provided on this After Visit Summary.  MyChart is used to connect with patients for Virtual Visits (Telemedicine).  Patients are able to view lab/test results, encounter notes, upcoming appointments, etc.  Non-urgent messages can be sent to your provider as well.   To learn more about what you can do with MyChart, go to [email or web address].    Your next appointment:   Based on test results  The format for your next appointment:   In Person  Provider:   Buford Dresser, MD

## 2021-10-22 NOTE — Progress Notes (Signed)
Cardiology Office Note:    Date:  10/22/2021   ID:  Terri Jensen, DOB 1965-04-26, MRN [id number]  PCP:  Leamon Arnt, MD  Cardiologist:  Buford Dresser, MD  Referring MD: Alycia Rossetti, MD   CC: new patient consultation for CV risk with family history of heart disease  History of Present Illness:    Terri Jensen is a 56 y.o. female with a hx of hyperlipidemia, hypothyroidism, nephrolithiasis, and OSA on CPAP, who is seen as a new consult at the request of Buelah Manis, Modena Nunnery, MD for the evaluation and management of hyperlipidemia and family history of coronary artery disease.  Referral notes from Dr. Buelah Manis personally reviewed.  Cardiovascular risk factors: Prior clinical ASCVD: None. Comorbid conditions: Hyperlipidemia - On 10 mg rosuvastatin (5-10 years, started when she was found to have high particle number), followed by PCP. Her LDL was 102, triglycerides 115 as of 04/2021. Prediabetes - previously on metformin. She is now on Ozempic [place] not covered by insurance); this is her second week. She endorses nephrolithiasis, no kidney disease. Metabolic syndrome/Obesity: Highest adult weight is her current weight, 225 lbs. Chronic inflammatory conditions: She was once thought to have rheumatoid arthritis, but blood work was never definitive. Tobacco use history: She quit smoking when she was 56 yo. Family history: Both of her parents had diabetes, CAD s/p CABG, died at 48 and 74 yo. Her father had his first heart attack before age 68, three angioplasties. Her mother had her first heart attack in her 39's. Both were long-term smokers.  Prior cardiac testing and/or incidental findings on other testing (ie coronary calcium): Exercise level: No formal exercise. 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 Recently went on a trip to the [place], and was able to walk without anginal symptoms. However, she has had intermittent chest pains over the years which she  attributes to prior breast implants, now removed. She states that she has never had an activity that she was unable to do. Current diet: Goes out for lunch daily at restaurants. Generally has larger portions, which she states is one of her biggest issues. May have a few glasses of wine a couple days a week socially.   Every once in a while she notices racing heart beats, usually when she first wakes up.  She endorses sleep apnea and uses her CPAP nightly.  She denies any shortness of breath, or peripheral edema. No lightheadedness, headaches, syncope, orthopnea, or PND.  Past Medical History:  Diagnosis Date   Anxiety    History of kidney stones    History of shingles    History of varicella    HLD (hyperlipidemia)    Hypothyroidism    Low vitamin D level    Perimenopause    PONV (postoperative nausea and vomiting)    Postmenopausal HRT (hormone replacement therapy) 03/16/2019   Prediabetes     Past Surgical History:  Procedure Laterality Date   AUGMENTATION MAMMAPLASTY Bilateral    BREAST EXCISIONAL BIOPSY Left 2019   CESAREAN SECTION     CHOLECYSTECTOMY     NOVASURE ABLATION  2005   RADIOACTIVE SEED GUIDED EXCISIONAL BREAST BIOPSY Left 05/04/2017   Procedure: RADIOACTIVE SEED GUIDED EXCISIONAL BREAST BIOPSY;  Surgeon: Rolm Bookbinder, MD;  Location: [facility];  Service: General;  Laterality: Left;   WISDOM TOOTH EXTRACTION      Current Medications: Current Outpatient Medications on File Prior to Visit  Medication Sig   [employer] SUNSCREEN SPF 30 EX  apply   EPINEPHrine 0.3 mg/0.3 mL IJ SOAJ injection Inject 0.3 mg into the muscle as needed for anaphylaxis.   FLUoxetine (PROZAC) 20 MG capsule TAKE 2 CAPSULES BY MOUTH  DAILY   levothyroxine (SYNTHROID) 50 MCG tablet Take 1 tablet (50 mcg total) by mouth daily before breakfast.   Multiple Vitamins-Minerals (MULTIVITAMIN ADULT) CHEW Chew 2 each by mouth daily.   rosuvastatin (CRESTOR) 10 MG tablet Take 1 tablet (10 mg total) by mouth  daily.   Semaglutide,0.25 or 0.'5MG'$ /DOS, (OZEMPIC, 0.25 OR 0.5 MG/DOSE,) 2 MG/1.5ML SOPN Inject 0.5 mg into the skin once a week.   tretinoin (RETIN-A) 0.1 % cream APPLY TO AFFECTED AREA EVERY DAY   triamcinolone ointment (KENALOG) 0.1 % SMARTSIG:Sparingly Topical Twice a Week   Vitamin D, Cholecalciferol, 50 MCG (2000 UT) CAPS Take by mouth.   YUVAFEM 10 MCG TABS vaginal tablet Place 1 tablet vaginally 2 (two) times a week.   No current facility-administered medications on file prior to visit.     Allergies:   Shellfish allergy, Tape, Iodine, Sulfamethoxazole-trimethoprim, Codeine, and Sulfa antibiotics   Social History   Tobacco Use   Smoking status: Former    Packs/day: 1.00    Years: 7.00    Total pack years: 7.00    Types: Cigarettes    Quit date: 04/23/1990    Years since quitting: 31.5   Smokeless tobacco: Never  Vaping Use   Vaping Use: Never used  Substance Use Topics   Alcohol use: Yes    Alcohol/week: 6.0 - 8.0 standard drinks of alcohol    Types: 4 - 6 Glasses of wine, 2 Standard drinks or equivalent per week   Drug use: No    Family History: family history includes Breast cancer in her mother; COPD in her father and mother; Cancer (age of onset: 36) in her paternal grandmother; Colon cancer in her paternal grandmother; Diabetes in her father and mother; Heart disease in her father and mother; Hypertension in her brother, father, and mother; Kidney disease in her father; Liver disease in her father; Stroke in her mother; Thyroid disease in her mother.  ROS:   Please see the history of present illness.  Additional pertinent ROS: Constitutional: Negative for chills, fever, night sweats, unintentional weight loss  HENT: Negative for ear pain and hearing loss.   Eyes: Negative for loss of vision and eye pain.  Respiratory: Negative for cough, sputum, wheezing.   Cardiovascular: See HPI. Gastrointestinal: Negative for abdominal pain, melena, and hematochezia.   Genitourinary: Negative for dysuria and hematuria.  Musculoskeletal: Negative for falls and myalgias.  Skin: Negative for itching and rash.  Neurological: Negative for focal weakness, focal sensory changes and loss of consciousness.  Endo/Heme/Allergies: Does not bruise/bleed easily.     EKGs/Labs/Other Studies Reviewed:    The following studies were reviewed today:  No prior cardiovascular studies available.   EKG:  EKG is personally reviewed.   10/22/2021:  NSR, 74 bpm  Recent Labs: 04/21/2021: ALT 35; BUN 15; Creatinine, Ser 0.76; Hemoglobin 14.2; Platelets 201.0; Potassium 4.3; Sodium 140; TSH 5.31   Recent Lipid Panel    Component Value Date/Time   CHOL 194 04/21/2021 1158   TRIG 115.0 04/21/2021 1158   HDL 68.70 04/21/2021 1158   CHOLHDL 3 04/21/2021 1158   VLDL 23.0 04/21/2021 1158   LDLCALC 102 (H) 04/21/2021 1158    Physical Exam:    VS:  BP 128/88 (BP Location: Right Arm, Patient Position: Sitting, Cuff Size: Large)   Pulse  74   Ht '5\' 4"'$  (1.626 m)   Wt 225 lb 8 oz (102.3 kg)   LMP 09/08/2013   BMI 38.71 kg/m     Wt Readings from Last 3 Encounters:  10/22/21 225 lb 8 oz (102.3 kg)  10/02/21 226 lb (102.5 kg)  04/21/21 219 lb 12.8 oz (99.7 kg)    GEN: Well nourished, well developed in no acute distress HEENT: Normal, moist mucous membranes NECK: No JVD CARDIAC: regular rhythm, normal S1 and S2, no rubs or gallops. No murmur. VASCULAR: Radial and DP pulses 2+ bilaterally. No carotid bruits RESPIRATORY:  Clear to auscultation without rales, wheezing or rhonchi  ABDOMEN: Soft, non-tender, non-distended MUSCULOSKELETAL:  Ambulates independently SKIN: Warm and dry, no edema NEUROLOGIC:  Alert and oriented x 3. No focal neuro deficits noted. PSYCHIATRIC:  Normal affect    ASSESSMENT:    1. Family history of premature CAD   2. Mixed hyperlipidemia   3. Obesity (BMI 35.0-39.9 without comorbidity)   4. OSA (obstructive sleep apnea)   5. Nutritional  counseling   6. Exercise counseling   7. Cardiac risk counseling    PLAN:    Family history of premature CAD Hyperlipidemia, mixed Obesity, BMI 38 OSA on CPAP -all risk factors for CAD -we discussed calcium score, hsCRP, and lp(a) today. She has already been on statin for years, so these would not be decision tools. Discussed this is outside how the guidelines studied hs-CRP and calcium score. After shared decision making, will proceed with lp(a) given strong family history and calcium score. Calcium score will be used to determine if she needs more intense medical therapy and/or ETT to rule out silent ischemia. Discussed that some coronary calcium expected with long term statin use, but it will be the overall amount of calcium that will help [place] make decisions -reviewed red flag warning signs that need immediate medical attention -working on lifestyle, weight loss. On GLP1RA ([place] not covered, currently on Ozempic). I think GLP1RA is a good choice for her with her CV risk factors given recent data on prevention. -lipids from 04/21/21 rivewed. LDL 102, TG 115, HDL 68.  Cardiac risk counseling and prevention recommendations: -recommend heart healthy/Mediterranean diet, with whole grains, fruits, vegetable, fish, lean meats, nuts, and olive oil. Limit salt. -recommend moderate walking, 3-5 times/week for 30-50 minutes each session. Aim for at least 150 minutes.week. Goal should be pace of 3 miles/hours, or walking 1.5 miles in 30 minutes -recommend avoidance of tobacco products. Avoid excess alcohol. -ASCVD risk score: we discussed this does not account for her family history The 10-year ASCVD risk score (Arnett DK, et al., 2019) is: 1.7%   Values used to calculate the score:     Age: 59 years     Sex: Female     Is Non-Hispanic African American: No     Diabetic: No     Tobacco smoker: No     Systolic Blood Pressure: 341 mmHg     Is BP treated: No     HDL Cholesterol: 68.7 mg/dL     Total  Cholesterol: 194 mg/dL    Plan for follow up: TBD based on results of testing, or sooner as needed.  Buford Dresser, MD, PhD, [facility]    Medication Adjustments/Labs and Tests Ordered: Current medicines are reviewed at length with the patient today.  Concerns regarding medicines are outlined above.   Orders Placed This Encounter  Procedures   CT CARDIAC SCORING (SELF PAY ONLY)  Lipoprotein A (LPA)   EKG 12-Lead   No orders of the defined types were placed in this encounter.  Patient Instructions  Medication Instructions:  Your Physician recommend you continue on your current medication as directed.    *If you need a refill on your cardiac medications before your next appointment, please call your pharmacy*   Lab Work: Your provider has recommended lab work today (LPa). Please have this collected at [facility] at [place]. The lab is open 8:00 am - 4:30 pm. Please avoid 12:00p - 1:00p for lunch hour. You do not need an appointment. Please go to [address] [place], [place] [address]. This is in the Primary Care office on the 3rd floor, let them know you are there for blood work and they will direct you to the lab.  If you have labs (blood work) drawn today and your tests are completely normal, you will receive your results only by: [place] (if you have MyChart) OR A paper copy in the mail If you have any lab test that is abnormal or we need to change your treatment, we will call you to review the results.   Testing/Procedures: CT coronary calcium score.   Test locations:  [place]   This is $99 out of pocket.   Coronary CalciumScan A coronary calcium scan is an imaging test used to look for deposits of calcium and other fatty materials (plaques) in the inner lining of the blood vessels of the heart (coronary arteries). These deposits of calcium and plaques can partly clog and narrow the  coronary arteries without producing any symptoms or warning signs. This puts a person at risk for a heart attack. This test can detect these deposits before symptoms develop. Tell a health care provider about: Any allergies you have. All medicines you are taking, including vitamins, herbs, eye drops, creams, and over-the-counter medicines. Any problems you or family members have had with anesthetic medicines. Any blood disorders you have. Any surgeries you have had. Any medical conditions you have. Whether you are pregnant or may be pregnant. What are the risks? Generally, this is a safe procedure. However, problems may occur, including: Harm to a pregnant woman and her unborn baby. This test involves the use of radiation. Radiation exposure can be dangerous to a pregnant woman and her unborn baby. If you are pregnant, you generally should not have this procedure done. Slight increase in the risk of cancer. This is because of the radiation involved in the test. What happens before the procedure? No preparation is needed for this procedure. What happens during the procedure? You will undress and remove any jewelry around your neck or chest. You will put on a hospital gown. Sticky electrodes will be placed on your chest. The electrodes will be connected to an electrocardiogram (ECG) machine to record a tracing of the electrical activity of your heart. A CT scanner will take pictures of your heart. During this time, you will be asked to lie still and hold your breath for 2-3 seconds while a picture of your heart is being taken. The procedure may vary among health care providers and hospitals. What happens after the procedure? You can get dressed. You can return to your normal activities. It is up to you to get the results of your test. Ask your health care provider, or the department that is doing the test, when your results will be ready. Summary A coronary calcium scan is an imaging test  used  to look for deposits of calcium and other fatty materials (plaques) in the inner lining of the blood vessels of the heart (coronary arteries). Generally, this is a safe procedure. Tell your health care provider if you are pregnant or may be pregnant. No preparation is needed for this procedure. A CT scanner will take pictures of your heart. You can return to your normal activities after the scan is done. This information is not intended to replace advice given to you by your health care provider. Make sure you discuss any questions you have with your health care provider. Document Released: 07/31/2007 Document Revised: 12/22/2015 Document Reviewed: 12/22/2015 Elsevier Interactive Patient Education  2017 [place]: At [facility], you and your health needs are our priority.  As part of our continuing mission to provide you with exceptional heart care, we have created designated Provider Care Teams.  These Care Teams include your primary Cardiologist (physician) and Advanced Practice Providers (APPs -  Physician Assistants and Nurse Practitioners) who all work together to provide you with the care you need, when you need it.  We recommend signing up for the patient portal called "MyChart".  Sign up information is provided on this After Visit Summary.  MyChart is used to connect with patients for Virtual Visits (Telemedicine).  Patients are able to view lab/test results, encounter notes, upcoming appointments, etc.  Non-urgent messages can be sent to your provider as well.   To learn more about what you can do with MyChart, go to [email or web address].    Your next appointment:   Based on test results  The format for your next appointment:   In Person  Provider:   Buford Dresser, MD           [facility] [place],acting as a scribe for Buford Dresser, MD.,have documented all relevant documentation on the behalf of Buford Dresser, MD,as  directed by  Buford Dresser, MD while in the presence of Buford Dresser, MD.  I, Buford Dresser, MD, have reviewed all documentation for this visit. The documentation on 10/22/21 for the exam, diagnosis, procedures, and orders are all accurate and complete.   Signed, Buford Dresser, MD PhD 10/22/2021 6:44 PM    [place]

## 2021-10-23 LAB — LIPOPROTEIN A (LPA): Lipoprotein (a): <8.4 nmol/L (ref <75.0)

## 2021-11-09 ENCOUNTER — Encounter: Payer: Self-pay | Admitting: *Deleted

## 2021-11-25 ENCOUNTER — Ambulatory Visit (HOSPITAL_BASED_OUTPATIENT_CLINIC_OR_DEPARTMENT_OTHER)
Admission: RE | Admit: 2021-11-25 | Discharge: 2021-11-25 | Disposition: A | Discharge status: Home / Self Care | Payer: Commercial Managed Care - PPO | Source: Ambulatory Visit | Attending: Cardiology | Admitting: Cardiology

## 2021-11-25 DIAGNOSIS — Z8249 Family history of ischemic heart disease and other diseases of the circulatory system: Secondary | ICD-10-CM | POA: Insufficient documentation

## 2021-11-25 DIAGNOSIS — E782 Mixed hyperlipidemia: Secondary | ICD-10-CM | POA: Insufficient documentation

## 2021-12-17 ENCOUNTER — Other Ambulatory Visit (HOSPITAL_BASED_OUTPATIENT_CLINIC_OR_DEPARTMENT_OTHER): Payer: Self-pay

## 2021-12-22 ENCOUNTER — Other Ambulatory Visit (HOSPITAL_BASED_OUTPATIENT_CLINIC_OR_DEPARTMENT_OTHER): Payer: Self-pay

## 2021-12-22 MED ORDER — OZEMPIC (2 MG/DOSE) 8 MG/3ML ~~LOC~~ SOPN
2.0000 mg | PEN_INJECTOR | SUBCUTANEOUS | 1 refills | Status: DC
  Filled 2021-12-22: qty 3, 28d supply, fill #0

## 2021-12-23 ENCOUNTER — Other Ambulatory Visit (HOSPITAL_BASED_OUTPATIENT_CLINIC_OR_DEPARTMENT_OTHER): Payer: Self-pay

## 2022-01-02 ENCOUNTER — Other Ambulatory Visit: Payer: Self-pay | Admitting: Family Medicine

## 2022-01-02 DIAGNOSIS — N951 Menopausal and female climacteric states: Secondary | ICD-10-CM

## 2022-01-06 ENCOUNTER — Other Ambulatory Visit (HOSPITAL_BASED_OUTPATIENT_CLINIC_OR_DEPARTMENT_OTHER): Payer: Self-pay

## 2022-01-06 MED ORDER — OZEMPIC (2 MG/DOSE) 8 MG/3ML ~~LOC~~ SOPN
PEN_INJECTOR | SUBCUTANEOUS | 1 refills | Status: DC
  Filled 2022-01-15: qty 3, 28d supply, fill #0

## 2022-01-15 ENCOUNTER — Other Ambulatory Visit (HOSPITAL_BASED_OUTPATIENT_CLINIC_OR_DEPARTMENT_OTHER): Payer: Self-pay

## 2022-01-19 ENCOUNTER — Other Ambulatory Visit (HOSPITAL_BASED_OUTPATIENT_CLINIC_OR_DEPARTMENT_OTHER): Payer: Self-pay

## 2022-02-18 ENCOUNTER — Other Ambulatory Visit (HOSPITAL_BASED_OUTPATIENT_CLINIC_OR_DEPARTMENT_OTHER): Payer: Self-pay

## 2022-02-18 MED ORDER — ZEPBOUND 5 MG/0.5ML ~~LOC~~ SOAJ
5.0000 mg | SUBCUTANEOUS | 1 refills | Status: DC
  Filled 2022-02-18 (×2): qty 2, 28d supply, fill #0

## 2022-03-03 ENCOUNTER — Other Ambulatory Visit: Payer: Self-pay | Admitting: Obstetrics & Gynecology

## 2022-03-03 DIAGNOSIS — Z1231 Encounter for screening mammogram for malignant neoplasm of breast: Secondary | ICD-10-CM

## 2022-03-04 ENCOUNTER — Ambulatory Visit (HOSPITAL_BASED_OUTPATIENT_CLINIC_OR_DEPARTMENT_OTHER): Payer: BC Managed Care – PPO | Admitting: Obstetrics & Gynecology

## 2022-03-04 ENCOUNTER — Other Ambulatory Visit (HOSPITAL_COMMUNITY)
Admission: RE | Admit: 2022-03-04 | Discharge: 2022-03-04 | Disposition: A | Discharge status: Home / Self Care | Payer: BC Managed Care – PPO | Source: Ambulatory Visit | Attending: Obstetrics & Gynecology | Admitting: Obstetrics & Gynecology

## 2022-03-04 ENCOUNTER — Encounter (HOSPITAL_BASED_OUTPATIENT_CLINIC_OR_DEPARTMENT_OTHER): Payer: Self-pay | Admitting: Obstetrics & Gynecology

## 2022-03-04 VITALS — BP 116/68 | HR 84 | Ht 64.0 in | Wt 195.6 lb

## 2022-03-04 DIAGNOSIS — Z124 Encounter for screening for malignant neoplasm of cervix: Secondary | ICD-10-CM | POA: Diagnosis present

## 2022-03-04 DIAGNOSIS — N904 Leukoplakia of vulva: Secondary | ICD-10-CM

## 2022-03-04 DIAGNOSIS — N952 Postmenopausal atrophic vaginitis: Secondary | ICD-10-CM

## 2022-03-04 DIAGNOSIS — Z01419 Encounter for gynecological examination (general) (routine) without abnormal findings: Secondary | ICD-10-CM

## 2022-03-04 DIAGNOSIS — Z803 Family history of malignant neoplasm of breast: Secondary | ICD-10-CM

## 2022-03-04 MED ORDER — TRIAMCINOLONE ACETONIDE 0.1 % EX OINT: TOPICAL_OINTMENT | CUTANEOUS | 1 refills | Status: AC

## 2022-03-04 NOTE — Progress Notes (Signed)
57 y.o. G2P2 Married White or Caucasian female here for annual exam/new patient exam.  Former patient of Dr. Cletis Media.  Has been told she has lichen sclerosus.  Uses triamcinolone ointment rarely when has itching.  Did not have any biopsy(ies) done.  Has family history of breast cancer in her mother.  Diagnosed in her 82's.  Tyrer cusick model done today.  She is right at the 20% liftime risk for breast cancer.  Never on HRT but has been on vaginal estradiol.  Has stopped due to her personal concerns.  Agree with this decision.  Non hormonal products discussed.    Patient's last menstrual period was 09/08/2013.          Sexually active: Yes.    The current method of family planning is post menopausal status.    Smoker:  no  Health Maintenance: Pap:  will obtain today History of abnormal Pap:  no MMG:  01/03/2021 Negative Colonoscopy:  2018.  Dr. Collene Mares.  Follow up 10 years. BMD:   guidelines reviewed Screening Labs: 04/2021.  Has appt scheduled.     reports that she quit smoking about 31 years ago. Her smoking use included cigarettes. She has a 7.00 pack-year smoking history. She has never used smokeless tobacco. She reports current alcohol use of about 6.0 - 8.0 standard drinks of alcohol per week. She reports that she does not use drugs.  Past Medical History:  Diagnosis Date   Anxiety    History of kidney stones    History of shingles    History of varicella    HLD (hyperlipidemia)    Hypothyroidism    Low vitamin D level    Perimenopause    PONV (postoperative nausea and vomiting)    Postmenopausal HRT (hormone replacement therapy) 03/16/2019   Prediabetes     Past Surgical History:  Procedure Laterality Date   AUGMENTATION MAMMAPLASTY Bilateral    BREAST EXCISIONAL BIOPSY Left 2019   CESAREAN SECTION     CHOLECYSTECTOMY     NOVASURE ABLATION  2005   RADIOACTIVE SEED GUIDED EXCISIONAL BREAST BIOPSY Left 05/04/2017   Procedure: RADIOACTIVE SEED GUIDED EXCISIONAL BREAST BIOPSY;   Surgeon: Rolm Bookbinder, MD;  Location: [facility];  Service: General;  Laterality: Left;   WISDOM TOOTH EXTRACTION      Current Outpatient Medications  Medication Sig Dispense Refill   CVS SUNSCREEN SPF 30 EX apply     EPINEPHrine 0.3 mg/0.3 mL IJ SOAJ injection Inject 0.3 mg into the muscle as needed for anaphylaxis. 1 each 2   FLUoxetine (PROZAC) 20 MG capsule TAKE 2 CAPSULES BY MOUTH DAILY 180 capsule 3   levothyroxine (SYNTHROID) 50 MCG tablet TAKE 1 TABLET BY MOUTH DAILY  BEFORE BREAKFAST 90 tablet 3   Multiple Vitamins-Minerals (MULTIVITAMIN ADULT) CHEW Chew 2 each by mouth daily.     rosuvastatin (CRESTOR) 10 MG tablet TAKE 1 TABLET BY MOUTH DAILY 90 tablet 3   tirzepatide (ZEPBOUND) 5 MG/0.5ML Pen Inject '5mg'$  into the skin once weekly. 2 mL 1   tretinoin (RETIN-A) 0.1 % cream APPLY TO AFFECTED AREA EVERY DAY     triamcinolone ointment (KENALOG) 0.1 % SMARTSIG:Sparingly Topical Twice a Week     Vitamin D, Cholecalciferol, 50 MCG (2000 UT) CAPS Take by mouth.     YUVAFEM 10 MCG TABS vaginal tablet Place 1 tablet vaginally 2 (two) times a week. (Patient not taking: Reported on 03/04/2022)     No current facility-administered medications for this visit.    Family  History  Problem Relation Age of Onset   Heart disease Mother    COPD Mother        Emphysema   Thyroid disease Mother    Hypertension Mother    Diabetes Mother    Stroke Mother    Breast cancer Mother    Heart disease Father    COPD Father        Emphysema   Hypertension Father    Diabetes Father    Kidney disease Father    Liver disease Father    Hypertension Brother    Cancer Paternal Grandmother 51       colon   Colon cancer Paternal Grandmother     ROS: Constitutional: negative Genitourinary:negative  Exam:   BP 116/68 (BP Location: Left Arm, Patient Position: Sitting, Cuff Size: Large)   Pulse 84   Ht '5\' 4"'$  (1.626 m) Comment: Reported  Wt 195 lb 9.6 oz (88.7 kg)   LMP 09/08/2013   BMI 33.57  kg/m   Height: '5\' 4"'$  (162.6 cm) (Reported)  General appearance: alert, cooperative and appears stated age Head: Normocephalic, without obvious abnormality, atraumatic Neck: no adenopathy, supple, symmetrical, trachea midline and thyroid normal to inspection and palpation Lungs: clear to auscultation bilaterally Breasts: normal appearance, no masses or tenderness Heart: regular rate and rhythm Abdomen: soft, non-tender; bowel sounds normal; no masses,  no organomegaly Extremities: extremities normal, atraumatic, no cyanosis or edema Skin: Skin color, texture, turgor normal. No rashes or lesions Lymph nodes: Cervical, supraclavicular, and axillary nodes normal. No abnormal inguinal nodes palpated Neurologic: Grossly normal   Pelvic: External genitalia:  no lesions              Urethra:  normal appearing urethra with no masses, tenderness or lesions              Bartholins and Skenes: normal                 Vagina: normal appearing vagina with normal color and no discharge, no lesions              Cervix: no lesions              Pap taken: Yes.   Bimanual Exam:  Uterus:  normal size, contour, position, consistency, mobility, non-tender              Adnexa: normal adnexa and no mass, fullness, tenderness               Rectovaginal: Confirms               Anus:  normal sphincter tone, no lesions  Chaperone, Octaviano Batty, CMA, was present for exam.  Assessment/Plan: 1. Well woman exam with routine gynecological exam - Pap smear obtained today - Mammogram 01/03/2021.  Has appt scheduled. - Colonoscopy 2018 with Dr. Collene Mares.  Follow up 10 years. - Bone mineral density will be planned closer to age 69 - lab work done with PCP, Dr. Billey Chang - vaccines reviewed/updated  2. Cervical cancer screening - Cytology - PAP( [place])  3. Lichen sclerosus of female genitalia - minimal changes on exam today.  Pt can continue to use triamcinolone topically as needed.   - triamcinolone  ointment (KENALOG) 0.1 %; Apply topically as needed for itching up to twice weekly.  Dispense: 30 g; Refill: 1  4. Vaginal atrophy - OTC products discussed and information provided to pt  5. Family history of breast cancer - Tyere Cusick model calculation  completed with pt's risk at 20%.  Diagnostic breat MRI discussed including pros/cons.  Increased false negative findings and increased number of breast biopsies discussed.  PT would like to proceed having this done and would like this about 6 months after next MMG.  Reminder placed.

## 2022-03-04 NOTE — Patient Instructions (Signed)
Vit E vaginal suppositories -- twice weekly (amazon or can be compounded at [facility]) Hyaluronic acid suppositories -- [place] (direct mail order) Hylauronic acid / Vit E called Gyntroph ( Amazon) Replens vaginal moisturizer - over the counter   Estring -- lowest systemic absorption

## 2022-03-08 LAB — CYTOLOGY - PAP
Comment: NEGATIVE
Diagnosis: NEGATIVE
High risk HPV: NEGATIVE

## 2022-03-23 ENCOUNTER — Other Ambulatory Visit (HOSPITAL_BASED_OUTPATIENT_CLINIC_OR_DEPARTMENT_OTHER): Payer: Self-pay

## 2022-03-23 MED ORDER — TIRZEPATIDE-WEIGHT MANAGEMENT 7.5 MG/0.5ML ~~LOC~~ SOAJ
7.5000 mg | SUBCUTANEOUS | 1 refills | Status: DC
  Filled 2022-03-23: qty 2, 28d supply, fill #0

## 2022-04-19 ENCOUNTER — Other Ambulatory Visit (HOSPITAL_BASED_OUTPATIENT_CLINIC_OR_DEPARTMENT_OTHER): Payer: Self-pay

## 2022-04-19 MED ORDER — ZEPBOUND 10 MG/0.5ML ~~LOC~~ SOAJ
10.0000 mg | SUBCUTANEOUS | 0 refills | Status: DC
  Filled 2022-04-19: qty 2, 28d supply, fill #0

## 2022-04-21 ENCOUNTER — Ambulatory Visit
Admission: RE | Admit: 2022-04-21 | Discharge: 2022-04-21 | Disposition: A | Discharge status: Home / Self Care | Payer: BC Managed Care – PPO | Source: Ambulatory Visit | Attending: Obstetrics & Gynecology | Admitting: Obstetrics & Gynecology

## 2022-04-21 DIAGNOSIS — Z1231 Encounter for screening mammogram for malignant neoplasm of breast: Secondary | ICD-10-CM

## 2022-04-26 ENCOUNTER — Encounter: Payer: Self-pay | Admitting: Family Medicine

## 2022-04-26 ENCOUNTER — Ambulatory Visit: Payer: BC Managed Care – PPO | Admitting: Family Medicine

## 2022-04-26 VITALS — BP 110/74 | HR 92 | Temp 97.9°F | Ht 64.0 in | Wt 183.4 lb

## 2022-04-26 DIAGNOSIS — E782 Mixed hyperlipidemia: Secondary | ICD-10-CM | POA: Diagnosis not present

## 2022-04-26 DIAGNOSIS — E039 Hypothyroidism, unspecified: Secondary | ICD-10-CM | POA: Diagnosis not present

## 2022-04-26 DIAGNOSIS — Z0000 Encounter for general adult medical examination without abnormal findings: Secondary | ICD-10-CM

## 2022-04-26 DIAGNOSIS — Z8249 Family history of ischemic heart disease and other diseases of the circulatory system: Secondary | ICD-10-CM | POA: Diagnosis not present

## 2022-04-26 DIAGNOSIS — F411 Generalized anxiety disorder: Secondary | ICD-10-CM

## 2022-04-26 DIAGNOSIS — M1990 Unspecified osteoarthritis, unspecified site: Secondary | ICD-10-CM

## 2022-04-26 DIAGNOSIS — G4733 Obstructive sleep apnea (adult) (pediatric): Secondary | ICD-10-CM

## 2022-04-26 LAB — CBC WITH DIFFERENTIAL/PLATELET
Basophils Absolute: 0 10*3/uL (ref 0.0–0.1)
Basophils Relative: 0.6 % (ref 0.0–3.0)
Eosinophils Absolute: 0.1 10*3/uL (ref 0.0–0.7)
Eosinophils Relative: 1.3 % (ref 0.0–5.0)
HCT: 41 % (ref 36.0–46.0)
Hemoglobin: 13.9 g/dL (ref 12.0–15.0)
Lymphocytes Relative: 26.5 % (ref 12.0–46.0)
Lymphs Abs: 1.3 10*3/uL (ref 0.7–4.0)
MCHC: 34 g/dL (ref 30.0–36.0)
MCV: 93.3 fl (ref 78.0–100.0)
Monocytes Absolute: 0.3 10*3/uL (ref 0.1–1.0)
Monocytes Relative: 7.3 % (ref 3.0–12.0)
Neutro Abs: 3.1 10*3/uL (ref 1.4–7.7)
Neutrophils Relative %: 64.3 % (ref 43.0–77.0)
Platelets: 188 10*3/uL (ref 150.0–400.0)
RBC: 4.4 Mil/uL (ref 3.87–5.11)
RDW: 12.4 % (ref 11.5–15.5)
WBC: 4.8 10*3/uL (ref 4.0–10.5)

## 2022-04-26 LAB — LIPID PANEL
Cholesterol: 139 mg/dL (ref 0–200)
HDL: 58.1 mg/dL (ref >39.00)
LDL Cholesterol: 66 mg/dL (ref 0–99)
NonHDL: 80.56
Total CHOL/HDL Ratio: 2
Triglycerides: 73 mg/dL (ref 0.0–149.0)
VLDL: 14.6 mg/dL (ref 0.0–40.0)

## 2022-04-26 LAB — COMPREHENSIVE METABOLIC PANEL
ALT: 26 U/L (ref 0–35)
AST: 21 U/L (ref 0–37)
Albumin: 4.1 g/dL (ref 3.5–5.2)
Alkaline Phosphatase: 77 U/L (ref 39–117)
BUN: 17 mg/dL (ref 6–23)
CO2: 23 mEq/L (ref 19–32)
Calcium: 9.6 mg/dL (ref 8.4–10.5)
Chloride: 104 mEq/L (ref 96–112)
Creatinine, Ser: 0.81 mg/dL (ref 0.40–1.20)
GFR: 80.64 mL/min (ref >60.00)
Glucose, Bld: 80 mg/dL (ref 70–99)
Potassium: 3.9 mEq/L (ref 3.5–5.1)
Sodium: 136 mEq/L (ref 135–145)
Total Bilirubin: 0.6 mg/dL (ref 0.2–1.2)
Total Protein: 6.5 g/dL (ref 6.0–8.3)

## 2022-04-26 LAB — HEMOGLOBIN A1C: Hgb A1c MFr Bld: 5.4 % (ref 4.6–6.5)

## 2022-04-26 LAB — T4, FREE: Free T4: 1.08 ng/dL (ref 0.60–1.60)

## 2022-04-26 LAB — TSH: TSH: 1.66 u[IU]/mL (ref 0.35–5.50)

## 2022-04-26 MED ORDER — FLUOXETINE HCL 20 MG PO CAPS: 40.0000 mg | ORAL_CAPSULE | Freq: Every day | ORAL | 3 refills | Status: DC

## 2022-04-26 MED ORDER — ROSUVASTATIN CALCIUM 10 MG PO TABS: 10.0000 mg | ORAL_TABLET | Freq: Every day | ORAL | 3 refills | Status: DC

## 2022-04-26 NOTE — Patient Instructions (Signed)

## 2022-04-26 NOTE — Progress Notes (Signed)
Subjective  Chief Complaint  Patient presents with   Annual Exam    Pt here for Annual exam and is not currently fasting     HPI: Terri Jensen is a 57 y.o. female who presents to [facility] today for a Female Wellness Visit. She also has the concerns and/or needs as listed above in the chief complaint. These will be addressed in addition to the Health Maintenance Visit.   Wellness Visit: annual visit with health maintenance review and exam without Pap  HM: screens are all current. Sees gyn: stopped hrt due to high risk for breast cancer. To increase screens with MRI/mammo. Doing ok from that stand point. Imms up to date. Feels well.  Chronic disease f/u and/or acute problem visit: (deemed necessary to be done in addition to the wellness visit): Obesity: [facility] and has lost about 40 pounds on zepbound. I reviewed notes. Tolerating well.  Hypothyroidism: stable on 46mg daily. Due for recheck.  Hld: non fasting for recheck on crestor 10. No AE. High risk for CAD due to family history. No cp. Zero calcium score 2023. Sees cards.  GAD: chronic prozac use and stable.  Inflammatory arthritis is stable.  On cpap and doing well.   Assessment  1. Annual physical exam   2. Mixed hyperlipidemia   3. GAD (generalized anxiety disorder)   4. Family history of premature CAD   5. Obstructive sleep apnea on CPAP   6. Inflammatory arthritis   7. Acquired hypothyroidism      Plan  Female Wellness Visit: Age appropriate Health Maintenance and Prevention measures were discussed with patient. Included topics are cancer screening recommendations, ways to keep healthy (see AVS) including dietary and exercise recommendations, regular eye and dental care, use of seat belts, and avoidance of moderate alcohol use and tobacco use.  BMI: discussed patient's BMI and encouraged positive lifestyle modifications to help get to or maintain a target  BMI. HM needs and immunizations were addressed and ordered. See below for orders. See HM and immunization section for updates. Routine labs and screening tests ordered including cmp, cbc and lipids where appropriate. Discussed recommendations regarding Vit D and calcium supplementation (see AVS)  Chronic disease management visit and/or acute problem visit: Weight loss clinic on zpebound 10now with good results.  H/o prediabetes/IR: recheck A1c. Should be normal now on meds and dite HLD on crestor 10. Recheck lipids and lfts (has h/o elevated lfts, ? Fatty liver).  Recheck thyroid levels on levothyroxine 562m daily. Clinically stable.  Arthritis and cpap/osa stable and following along with specialists. Reviewed notes.  Mood is well controlled on long term prozac 40.   Follow up: 12 mo for cpe  Orders Placed This Encounter  Procedures   Hemoglobin A1c   CBC with Differential/Platelet   Comprehensive metabolic panel   Lipid panel   TSH   T3   T4, free   Meds ordered this encounter  Medications   FLUoxetine (PROZAC) 20 MG capsule    Sig: Take 2 capsules (40 mg total) by mouth daily.    Dispense:  180 capsule    Refill:  3   rosuvastatin (CRESTOR) 10 MG tablet    Sig: Take 1 tablet (10 mg total) by mouth daily.    Dispense:  90 tablet    Refill:  3      Body mass index is 31.48 kg/m. Wt Readings from Last 3 Encounters:  04/26/22 183 lb  6.4 oz (83.2 kg)  03/04/22 195 lb 9.6 oz (88.7 kg)  10/22/21 225 lb 8 oz (102.3 kg)     Patient Active Problem List   Diagnosis Date Noted   Family history of premature CAD 04/21/2021    Priority: High    Dad 34; mom 91s; + HTN, +HLD and smokers Calcium score of 0, 2023    Obstructive sleep apnea on CPAP 04/21/2021    Priority: High   GAD (generalized anxiety disorder) 03/16/2019    Priority: High   Acquired hypothyroidism 08/05/2016    Priority: High   Obesity (BMI 35.0-39.9 without comorbidity) 04/05/2016    Priority: High    Mixed hyperlipidemia 04/05/2016    Priority: High   Elevated LFTs 03/28/2020    Priority: Medium    Lichen sclerosus of female genitalia 12/20/2018    Priority: Medium    Lumbar radiculopathy 02/21/2018    Priority: Medium    Inflammatory arthritis 01/03/2018    Priority: Medium    Kidney stones     Priority: Medium    Low vitamin D level 04/05/2016    Priority: Low   Health Maintenance  Topic Date Due   COVID-19 Vaccine (7 - 2023-24 season) 05/12/2022 (Originally 01/30/2022)   MAMMOGRAM  04/21/2023   COLONOSCOPY (Pts 45-11yr Insurance coverage will need to be confirmed)  02/17/2026   DTaP/Tdap/Td (2 - Td or Tdap) 08/05/2026   PAP SMEAR-Modifier  03/05/2027   INFLUENZA VACCINE  Completed   Hepatitis C Screening  Completed   Zoster Vaccines- Shingrix  Completed   HPV VACCINES  Aged Out   HIV Screening  Discontinued   Immunization History  Administered Date(s) Administered   COVID-19, mRNA, vaccine(Comirnaty)12 years and older 12/05/2021   Influenza Inj Mdck Quad Pf 12/05/2021   Influenza,inj,Quad PF,6+ Mos 11/09/2016   Influenza,inj,quad, With Preservative 10/16/2016   Influenza-Unspecified 02/15/2013, 11/15/2016, 11/15/2017, 11/16/2019, 11/14/2020   PFIZER(Purple Top)SARS-COV-2 Vaccination 04/14/2019, 05/05/2019, 01/24/2020   Tdap 08/04/2016   Unspecified SARS-COV-2 Vaccination 04/16/2019, 05/17/2019   Zoster Recombinat (Shingrix) 04/21/2021, 07/28/2021   We updated and reviewed the patient's past history in detail and it is documented below. Allergies: Patient is allergic to shellfish allergy, tape, iodine, sulfamethoxazole-trimethoprim, codeine, and sulfa antibiotics. Past Medical History Patient  has a past medical history of Anxiety, History of kidney stones, History of shingles, HLD (hyperlipidemia), Hypothyroidism, Lichen sclerosus, Low vitamin D level, Perimenopause, PONV (postoperative nausea and vomiting), Postmenopausal HRT (hormone replacement therapy)  (03/16/2019), and Prediabetes. Past Surgical History Patient  has a past surgical history that includes Novasure ablation (2005); Cholecystectomy; Wisdom tooth extraction; Cesarean section; Radioactive seed guided excisional breast biopsy (Left, 05/04/2017); Augmentation mammaplasty (Bilateral); Breast excisional biopsy (Left, 2019); and Lithotripsy. Family History: Patient family history includes Breast cancer (age of onset: 577 in her mother; COPD in her father and mother; Cancer (age of onset: 716 in her paternal grandmother; Colon cancer in her paternal grandmother; Diabetes in her father and mother; Heart disease in her father and mother; Hypertension in her brother, father, and mother; Kidney disease in her father; Liver disease in her father; Stroke in her mother; Thyroid disease in her mother. Social History:  Patient  reports that she quit smoking about 32 years ago. Her smoking use included cigarettes. She has a 7.00 pack-year smoking history. She has never used smokeless tobacco. She reports current alcohol use of about 6.0 - 8.0 standard drinks of alcohol per week. She reports that she does not use drugs.  Review of Systems: Constitutional: negative  for fever or malaise Ophthalmic: negative for photophobia, double vision or loss of vision Cardiovascular: negative for chest pain, dyspnea on exertion, or new LE swelling Respiratory: negative for SOB or persistent cough Gastrointestinal: negative for abdominal pain, change in bowel habits or melena Genitourinary: negative for dysuria or gross hematuria, no abnormal uterine bleeding or disharge Musculoskeletal: negative for new gait disturbance or muscular weakness Integumentary: negative for new or persistent rashes, no breast lumps Neurological: negative for TIA or stroke symptoms Psychiatric: negative for SI or delusions Allergic/Immunologic: negative for hives  Patient Care Team    Relationship Specialty Notifications Start End   Leamon Arnt, MD PCP - General Family Medicine  03/16/19   Buford Dresser, MD PCP - Cardiology Cardiology  10/22/21   Juanita Craver, MD Consulting Physician Gastroenterology  01/03/18   Devra Dopp, MD Referring Physician Dermatology  01/03/18   Loleta Books, MD Consulting Physician Ophthalmology  01/03/18   Delsa Bern, MD Consulting Physician Obstetrics and Gynecology  01/03/18   Lahoma Rocker, MD Consulting Physician Rheumatology  08/06/18     Objective  Vitals: BP 110/74   Pulse 92   Temp 97.9 F (36.6 C)   Ht '5\' 4"'$  (1.626 m)   Wt 183 lb 6.4 oz (83.2 kg)   LMP 09/08/2013   SpO2 97%   BMI 31.48 kg/m  General:  Well developed, well nourished, no acute distress  Psych:  Alert and orientedx3,normal mood and affect HEENT:  Normocephalic, atraumatic, non-icteric sclera,  supple neck without adenopathy, mass or thyromegaly Cardiovascular:  Normal S1, S2, RRR without gallop, rub or murmur Respiratory:  Good breath sounds bilaterally, CTAB with normal respiratory effort Gastrointestinal: normal bowel sounds, soft, non-tender, no noted masses. No HSM MSK: no deformities, contusions. Joints are without erythema or swelling.  Skin:  Warm, no rashes or suspicious lesions noted Neuro: no tremor.   Commons side effects, risks, benefits, and alternatives for medications and treatment plan prescribed today were discussed, and the patient expressed understanding of the given instructions. Patient is instructed to call or message via MyChart if he/she has any questions or concerns regarding our treatment plan. No barriers to understanding were identified. We discussed Red Flag symptoms and signs in detail. Patient expressed understanding regarding what to do in case of urgent or emergency type symptoms.  Medication list was reconciled, printed and provided to the patient in AVS. Patient instructions and summary information was reviewed with the patient as documented in  the AVS. This note was prepared with assistance of Dragon voice recognition software. Occasional wrong-word or sound-a-like substitutions may have occurred due to the inherent limitations of voice recognition software

## 2022-04-27 LAB — T3: T3, Total: 91 ng/dL (ref 76–181)

## 2022-05-17 ENCOUNTER — Encounter (HOSPITAL_BASED_OUTPATIENT_CLINIC_OR_DEPARTMENT_OTHER): Payer: Self-pay | Admitting: Obstetrics & Gynecology

## 2022-07-08 ENCOUNTER — Other Ambulatory Visit (HOSPITAL_BASED_OUTPATIENT_CLINIC_OR_DEPARTMENT_OTHER): Payer: Self-pay

## 2022-07-08 MED ORDER — ZEPBOUND 10 MG/0.5ML ~~LOC~~ SOAJ
SUBCUTANEOUS | 1 refills | Status: DC
  Filled 2022-07-08 – 2022-07-13 (×4): qty 2, 28d supply, fill #0
  Filled 2023-02-04: qty 2, 28d supply, fill #1

## 2022-07-09 ENCOUNTER — Other Ambulatory Visit: Payer: Self-pay

## 2022-07-09 ENCOUNTER — Encounter (HOSPITAL_BASED_OUTPATIENT_CLINIC_OR_DEPARTMENT_OTHER): Payer: Self-pay

## 2022-07-09 ENCOUNTER — Other Ambulatory Visit (HOSPITAL_BASED_OUTPATIENT_CLINIC_OR_DEPARTMENT_OTHER): Payer: Self-pay

## 2022-07-13 ENCOUNTER — Other Ambulatory Visit (HOSPITAL_BASED_OUTPATIENT_CLINIC_OR_DEPARTMENT_OTHER): Payer: Self-pay

## 2022-08-16 ENCOUNTER — Other Ambulatory Visit (HOSPITAL_BASED_OUTPATIENT_CLINIC_OR_DEPARTMENT_OTHER): Payer: Self-pay

## 2022-08-16 MED ORDER — ZEPBOUND 10 MG/0.5ML ~~LOC~~ SOAJ
10.0000 mg | SUBCUTANEOUS | 1 refills | Status: DC
  Filled 2022-08-16: qty 2, 28d supply, fill #0

## 2022-08-18 ENCOUNTER — Other Ambulatory Visit (HOSPITAL_BASED_OUTPATIENT_CLINIC_OR_DEPARTMENT_OTHER): Payer: Self-pay

## 2022-08-18 MED ORDER — ZEPBOUND 12.5 MG/0.5ML ~~LOC~~ SOAJ
12.5000 mg | SUBCUTANEOUS | 1 refills | Status: AC
  Filled 2022-08-18: qty 2, 28d supply, fill #0
  Filled 2023-02-04 – 2023-04-19 (×2): qty 2, 28d supply, fill #1

## 2022-08-20 ENCOUNTER — Other Ambulatory Visit (HOSPITAL_BASED_OUTPATIENT_CLINIC_OR_DEPARTMENT_OTHER): Payer: Self-pay

## 2022-09-14 ENCOUNTER — Other Ambulatory Visit (HOSPITAL_BASED_OUTPATIENT_CLINIC_OR_DEPARTMENT_OTHER): Payer: Self-pay

## 2022-09-14 ENCOUNTER — Other Ambulatory Visit: Payer: Self-pay

## 2022-09-14 MED ORDER — ZEPBOUND 10 MG/0.5ML ~~LOC~~ SOAJ
10.0000 mg | SUBCUTANEOUS | 1 refills | Status: DC
  Filled 2022-09-14: qty 2, 28d supply, fill #0
  Filled 2022-11-24: qty 2, 28d supply, fill #1

## 2022-09-20 ENCOUNTER — Other Ambulatory Visit (HOSPITAL_BASED_OUTPATIENT_CLINIC_OR_DEPARTMENT_OTHER): Payer: Self-pay

## 2022-10-27 ENCOUNTER — Other Ambulatory Visit: Payer: Self-pay

## 2022-10-27 ENCOUNTER — Other Ambulatory Visit (HOSPITAL_BASED_OUTPATIENT_CLINIC_OR_DEPARTMENT_OTHER): Payer: Self-pay

## 2022-10-27 MED ORDER — ZEPBOUND 10 MG/0.5ML ~~LOC~~ SOAJ
10.0000 mg | SUBCUTANEOUS | 1 refills | Status: DC
  Filled 2022-10-27: qty 2, 28d supply, fill #0
  Filled 2023-02-04: qty 2, 28d supply, fill #1

## 2022-10-29 ENCOUNTER — Other Ambulatory Visit (HOSPITAL_BASED_OUTPATIENT_CLINIC_OR_DEPARTMENT_OTHER): Payer: Self-pay | Admitting: *Deleted

## 2022-10-29 ENCOUNTER — Encounter (HOSPITAL_BASED_OUTPATIENT_CLINIC_OR_DEPARTMENT_OTHER): Payer: Self-pay | Admitting: Obstetrics & Gynecology

## 2022-10-29 DIAGNOSIS — Z803 Family history of malignant neoplasm of breast: Secondary | ICD-10-CM

## 2022-10-29 DIAGNOSIS — Z9189 Other specified personal risk factors, not elsewhere classified: Secondary | ICD-10-CM

## 2022-11-11 ENCOUNTER — Encounter: Payer: Self-pay | Admitting: Pulmonary Disease

## 2022-11-11 ENCOUNTER — Ambulatory Visit: Payer: Commercial Managed Care - PPO | Admitting: Pulmonary Disease

## 2022-11-11 VITALS — BP 110/74 | HR 77 | Temp 97.7°F | Ht 63.0 in | Wt 152.6 lb

## 2022-11-11 DIAGNOSIS — G4733 Obstructive sleep apnea (adult) (pediatric): Secondary | ICD-10-CM

## 2022-11-11 NOTE — Patient Instructions (Signed)
Continue using CPAP on a nightly basis  Call us with significant concerns  I will see you a year from now

## 2022-11-11 NOTE — Progress Notes (Signed)
Terri Jensen    [id number]    April 07, 1965  Primary Care Physician:Andy, Malachi Bonds, MD  Referring Physician: Willow Ora, MD [address] [place],  [place] [address]  Chief complaint:   Patient with poor sleep, snoring, multiple awakenings  HPI:  She did have a sleep study done about 5 years ago that showed mild to moderate sleep apnea is what she remembers Repeat study 07/20/2020 shows mild obstructive sleep apnea with mild oxygen desaturations She has been using CPAP  Tolerating CPAP well without significant problems  Feels she is benefiting from CPAP use Feels rested in the morning  Usually tries to go to bed between 10 and 11 Takes a 10 to 15 minutes to fall asleep about 3 awakenings Final wake up time about 630  Both parents snored Reformed smoker No regular exercises   Outpatient Encounter Medications as of 11/11/2022  Medication Sig   [employer] SUNSCREEN SPF 30 EX apply   EPINEPHrine 0.3 mg/0.3 mL IJ SOAJ injection Inject 0.3 mg into the muscle as needed for anaphylaxis.   FLUoxetine (PROZAC) 20 MG capsule Take 2 capsules (40 mg total) by mouth daily.   levothyroxine (SYNTHROID) 50 MCG tablet TAKE 1 TABLET BY MOUTH DAILY  BEFORE BREAKFAST (Patient taking differently: Take 25 mcg by mouth daily before breakfast.)   Multiple Vitamins-Minerals (MULTIVITAMIN ADULT) CHEW Chew 2 each by mouth daily.   rosuvastatin (CRESTOR) 10 MG tablet Take 1 tablet (10 mg total) by mouth daily.   tirzepatide (ZEPBOUND) 10 MG/0.5ML Pen Inject 10 mg under the skin once weekly.   tirzepatide (ZEPBOUND) 10 MG/0.5ML Pen Inject 10 mg into the skin once a week.   tirzepatide (ZEPBOUND) 10 MG/0.5ML Pen Inject 10 mg into the skin once a week.   tirzepatide (ZEPBOUND) 12.5 MG/0.5ML Pen Inject 12.5 mg into the skin once a week.   tretinoin (RETIN-A) 0.1 % cream APPLY TO AFFECTED AREA EVERY DAY   triamcinolone ointment (KENALOG) 0.1 % Apply topically as needed for itching up to  twice weekly.   Vitamin D, Cholecalciferol, 50 MCG (2000 UT) CAPS Take by mouth.   No facility-administered encounter medications on file as of 11/11/2022.    Allergies as of 11/11/2022 - Review Complete 11/11/2022  Allergen Reaction Noted   Shellfish allergy Anaphylaxis 07/29/2011   Tape Rash 07/29/2011   Iodine  04/21/2021   Sulfamethoxazole-trimethoprim Other (See Comments) 02/23/2021   Codeine Nausea And Vomiting 04/05/2016   Sulfa antibiotics Rash 07/23/2016    Past Medical History:  Diagnosis Date   Anxiety    History of kidney stones    History of shingles    HLD (hyperlipidemia)    Hypothyroidism    Lichen sclerosus    Low vitamin D level    Perimenopause    PONV (postoperative nausea and vomiting)    Postmenopausal HRT (hormone replacement therapy) 03/16/2019   Prediabetes     Past Surgical History:  Procedure Laterality Date   AUGMENTATION MAMMAPLASTY Bilateral    and removed later   BREAST EXCISIONAL BIOPSY Left 2019   CESAREAN SECTION     CHOLECYSTECTOMY     LITHOTRIPSY     NOVASURE ABLATION  2005   RADIOACTIVE SEED GUIDED EXCISIONAL BREAST BIOPSY Left 05/04/2017   Procedure: RADIOACTIVE SEED GUIDED EXCISIONAL BREAST BIOPSY;  Surgeon: Emelia Loron, MD;  Location: [facility] OR;  Service: General;  Laterality: Left;   WISDOM TOOTH EXTRACTION      Family History  Problem  Relation Age of Onset   Heart disease Mother    COPD Mother        Emphysema   Thyroid disease Mother    Hypertension Mother    Diabetes Mother    Stroke Mother    Breast cancer Mother 68   Heart disease Father    COPD Father        Emphysema   Hypertension Father    Diabetes Father    Kidney disease Father    Liver disease Father    Hypertension Brother    Cancer Paternal Grandmother 27       colon   Colon cancer Paternal Grandmother     Social History   Socioeconomic History   Marital status: Married    Spouse name: Not on file   Number of children: 2   Years of  education: Not on file   Highest education level: Not on file  Occupational History   Occupation: Admin    Employer: [employer]  Tobacco Use   Smoking status: Former    Current packs/day: 0.00    Average packs/day: 1 pack/day for 7.0 years (7.0 ttl pk-yrs)    Types: Cigarettes    Start date: 04/23/1983    Quit date: 04/23/1990    Years since quitting: 32.5   Smokeless tobacco: Never  Vaping Use   Vaping status: Never Used  Substance and Sexual Activity   Alcohol use: Yes    Alcohol/week: 6.0 - 8.0 standard drinks of alcohol    Types: 4 - 6 Glasses of wine, 2 Standard drinks or equivalent per week   Drug use: No   Sexual activity: Yes    Birth control/protection: Other-see comments    Comment: VAS  Other Topics Concern   Not on file  Social History Narrative   Not on file   Social Determinants of Health   Financial Resource Strain: Not on file  Food Insecurity: Not on file  Transportation Needs: Not on file  Physical Activity: Not on file  Stress: Not on file  Social Connections: Unknown (06/30/2021)   [facility] from [facility]   Social Network    Social Network: Not on file  Intimate Partner Violence: Unknown (05/21/2021)   Received from [facility], [facility]   HITS    Physically Hurt: Not on file    Insult or Talk Down To: Not on file    Threaten Physical Harm: Not on file    Scream or Curse: Not on file    Review of Systems  Constitutional:  Positive for fatigue.  Respiratory:  Negative for shortness of breath.   Psychiatric/Behavioral:  Positive for sleep disturbance.     Vitals:   11/11/22 0932  BP: 110/74  Pulse: 77  Temp: 97.7 F (36.5 C)  SpO2: 98%     Physical Exam Constitutional:      Appearance: She is obese.  HENT:     Mouth/Throat:     Mouth: Mucous membranes are moist.     Comments: Macroglossia, crowded oropharynx, Mallampati 3 Eyes:     Pupils: Pupils are equal, round, and reactive to light.  Cardiovascular:      Rate and Rhythm: Normal rate.     Heart sounds: No murmur heard.    No friction rub.  Pulmonary:     Effort: No respiratory distress.     Breath sounds: No stridor. No wheezing.  Musculoskeletal:     Cervical back: No rigidity or tenderness.  Neurological:  Mental Status: She is alert.  Psychiatric:        Mood and Affect: Mood normal.   Epworth Sleepiness Scale of 10  Data Reviewed: Compliance data reviewed showing 97% compliance AutoSet 5-15 Residual AHI of 0.6  Assessment:  Mild obstructive sleep apnea Tolerating CPAP well Not having significant limitations with CPAP use  Continues to benefit from CPAP   Plan/Recommendations: Continue CPAP nightly  Follow-up a year from now  Encouraged to call with any significant concerns    Virl Diamond MD [facility] 11/11/2022, 9:10 PM  CC: Willow Ora, MD

## 2022-11-16 ENCOUNTER — Other Ambulatory Visit (HOSPITAL_BASED_OUTPATIENT_CLINIC_OR_DEPARTMENT_OTHER): Payer: Self-pay

## 2022-11-16 MED ORDER — ZEPBOUND 10 MG/0.5ML ~~LOC~~ SOAJ
10.0000 mg | SUBCUTANEOUS | 1 refills | Status: DC
  Filled 2022-11-16 – 2023-02-04 (×2): qty 2, 28d supply, fill #0

## 2022-11-24 ENCOUNTER — Other Ambulatory Visit (HOSPITAL_BASED_OUTPATIENT_CLINIC_OR_DEPARTMENT_OTHER): Payer: Self-pay

## 2022-11-30 ENCOUNTER — Other Ambulatory Visit: Payer: Self-pay | Admitting: Family Medicine

## 2022-11-30 DIAGNOSIS — F411 Generalized anxiety disorder: Secondary | ICD-10-CM

## 2022-12-15 ENCOUNTER — Ambulatory Visit
Admission: RE | Admit: 2022-12-15 | Discharge: 2022-12-15 | Disposition: A | Discharge status: Home / Self Care | Payer: Commercial Managed Care - PPO | Source: Ambulatory Visit | Attending: Obstetrics & Gynecology | Admitting: Obstetrics & Gynecology

## 2022-12-15 ENCOUNTER — Encounter (HOSPITAL_BASED_OUTPATIENT_CLINIC_OR_DEPARTMENT_OTHER): Payer: Self-pay | Admitting: Obstetrics & Gynecology

## 2022-12-15 DIAGNOSIS — Z9189 Other specified personal risk factors, not elsewhere classified: Secondary | ICD-10-CM

## 2022-12-15 DIAGNOSIS — Z803 Family history of malignant neoplasm of breast: Secondary | ICD-10-CM

## 2022-12-15 MED ORDER — GADOPICLENOL 0.5 MMOL/ML IV SOLN
7.0000 mL | Freq: Once | INTRAVENOUS | Status: AC | PRN
  Administered 2022-12-15: 7 mL via INTRAVENOUS

## 2022-12-16 ENCOUNTER — Other Ambulatory Visit: Payer: Self-pay | Admitting: Obstetrics & Gynecology

## 2022-12-16 DIAGNOSIS — R9389 Abnormal findings on diagnostic imaging of other specified body structures: Secondary | ICD-10-CM

## 2022-12-16 DIAGNOSIS — N6311 Unspecified lump in the right breast, upper outer quadrant: Secondary | ICD-10-CM

## 2022-12-20 ENCOUNTER — Ambulatory Visit
Admission: RE | Admit: 2022-12-20 | Discharge: 2022-12-20 | Disposition: A | Discharge status: Home / Self Care | Payer: Commercial Managed Care - PPO | Source: Ambulatory Visit | Attending: Obstetrics & Gynecology | Admitting: Obstetrics & Gynecology

## 2022-12-20 DIAGNOSIS — R9389 Abnormal findings on diagnostic imaging of other specified body structures: Secondary | ICD-10-CM

## 2022-12-20 DIAGNOSIS — N6311 Unspecified lump in the right breast, upper outer quadrant: Secondary | ICD-10-CM

## 2022-12-21 ENCOUNTER — Other Ambulatory Visit: Payer: Self-pay | Admitting: Obstetrics & Gynecology

## 2022-12-21 DIAGNOSIS — R928 Other abnormal and inconclusive findings on diagnostic imaging of breast: Secondary | ICD-10-CM

## 2022-12-24 ENCOUNTER — Ambulatory Visit
Admission: RE | Admit: 2022-12-24 | Discharge: 2022-12-24 | Disposition: A | Discharge status: Home / Self Care | Payer: Commercial Managed Care - PPO | Source: Ambulatory Visit | Attending: Obstetrics & Gynecology | Admitting: Obstetrics & Gynecology

## 2022-12-24 DIAGNOSIS — R928 Other abnormal and inconclusive findings on diagnostic imaging of breast: Secondary | ICD-10-CM

## 2022-12-24 HISTORY — PX: BREAST BIOPSY: SHX20

## 2022-12-24 MED ORDER — GADOPICLENOL 0.5 MMOL/ML IV SOLN
7.0000 mL | Freq: Once | INTRAVENOUS | Status: AC | PRN
  Administered 2022-12-24: 7 mL via INTRAVENOUS

## 2022-12-27 LAB — SURGICAL PATHOLOGY

## 2023-01-06 ENCOUNTER — Ambulatory Visit: Payer: Self-pay | Admitting: General Surgery

## 2023-01-06 DIAGNOSIS — D241 Benign neoplasm of right breast: Secondary | ICD-10-CM | POA: Insufficient documentation

## 2023-01-06 MED ORDER — KETOROLAC TROMETHAMINE 15 MG/ML IJ SOLN: 15.0000 mg | INTRAMUSCULAR | Status: AC

## 2023-01-10 ENCOUNTER — Other Ambulatory Visit (HOSPITAL_BASED_OUTPATIENT_CLINIC_OR_DEPARTMENT_OTHER): Payer: Self-pay

## 2023-01-10 ENCOUNTER — Other Ambulatory Visit: Payer: Self-pay | Admitting: General Surgery

## 2023-01-10 DIAGNOSIS — D241 Benign neoplasm of right breast: Secondary | ICD-10-CM

## 2023-01-10 MED ORDER — ZEPBOUND 10 MG/0.5ML ~~LOC~~ SOAJ
10.0000 mg | SUBCUTANEOUS | 1 refills | Status: DC
  Filled 2023-01-10: qty 2, 28d supply, fill #0
  Filled 2023-02-04 – 2023-02-17 (×2): qty 2, 28d supply, fill #1

## 2023-01-11 ENCOUNTER — Other Ambulatory Visit (HOSPITAL_BASED_OUTPATIENT_CLINIC_OR_DEPARTMENT_OTHER): Payer: Self-pay

## 2023-02-02 ENCOUNTER — Other Ambulatory Visit: Payer: Self-pay

## 2023-02-02 ENCOUNTER — Encounter (HOSPITAL_BASED_OUTPATIENT_CLINIC_OR_DEPARTMENT_OTHER): Payer: Self-pay | Admitting: General Surgery

## 2023-02-04 ENCOUNTER — Other Ambulatory Visit (HOSPITAL_BASED_OUTPATIENT_CLINIC_OR_DEPARTMENT_OTHER): Payer: Self-pay

## 2023-02-15 ENCOUNTER — Other Ambulatory Visit (HOSPITAL_BASED_OUTPATIENT_CLINIC_OR_DEPARTMENT_OTHER): Payer: Self-pay

## 2023-02-15 ENCOUNTER — Other Ambulatory Visit: Payer: Self-pay

## 2023-02-17 ENCOUNTER — Other Ambulatory Visit (HOSPITAL_BASED_OUTPATIENT_CLINIC_OR_DEPARTMENT_OTHER): Payer: Self-pay

## 2023-02-17 ENCOUNTER — Other Ambulatory Visit (HOSPITAL_COMMUNITY): Payer: Self-pay

## 2023-02-17 ENCOUNTER — Encounter (HOSPITAL_BASED_OUTPATIENT_CLINIC_OR_DEPARTMENT_OTHER): Payer: Self-pay

## 2023-02-17 ENCOUNTER — Other Ambulatory Visit: Payer: Self-pay

## 2023-02-17 ENCOUNTER — Ambulatory Visit
Admission: RE | Admit: 2023-02-17 | Discharge: 2023-02-17 | Disposition: A | Discharge status: Home / Self Care | Payer: Commercial Managed Care - PPO | Source: Ambulatory Visit | Attending: General Surgery | Admitting: General Surgery

## 2023-02-17 DIAGNOSIS — D241 Benign neoplasm of right breast: Secondary | ICD-10-CM

## 2023-02-17 HISTORY — PX: BREAST BIOPSY: SHX20

## 2023-02-17 MED ORDER — CHLORHEXIDINE GLUCONATE CLOTH 2 % EX PADS: 6.0000 | MEDICATED_PAD | Freq: Once | CUTANEOUS | Status: DC

## 2023-02-17 NOTE — Progress Notes (Signed)

## 2023-02-18 ENCOUNTER — Encounter (HOSPITAL_BASED_OUTPATIENT_CLINIC_OR_DEPARTMENT_OTHER): Payer: Self-pay | Admitting: General Surgery

## 2023-02-18 ENCOUNTER — Other Ambulatory Visit: Payer: Self-pay

## 2023-02-18 ENCOUNTER — Ambulatory Visit
Admission: RE | Admit: 2023-02-18 | Discharge: 2023-02-18 | Disposition: A | Discharge status: Home / Self Care | Payer: Commercial Managed Care - PPO | Source: Ambulatory Visit | Attending: General Surgery | Admitting: General Surgery

## 2023-02-18 ENCOUNTER — Ambulatory Visit (HOSPITAL_BASED_OUTPATIENT_CLINIC_OR_DEPARTMENT_OTHER)
Admission: RE | Admit: 2023-02-18 | Discharge: 2023-02-18 | Disposition: A | Discharge status: Home / Self Care | Payer: Commercial Managed Care - PPO | Source: Ambulatory Visit | Attending: General Surgery | Admitting: General Surgery

## 2023-02-18 ENCOUNTER — Encounter: Payer: Self-pay | Admitting: Family Medicine

## 2023-02-18 ENCOUNTER — Encounter (HOSPITAL_BASED_OUTPATIENT_CLINIC_OR_DEPARTMENT_OTHER)
Admission: RE | Disposition: A | Discharge status: Home / Self Care | Payer: Self-pay | Source: Ambulatory Visit | Attending: General Surgery

## 2023-02-18 ENCOUNTER — Ambulatory Visit (HOSPITAL_BASED_OUTPATIENT_CLINIC_OR_DEPARTMENT_OTHER): Payer: Commercial Managed Care - PPO | Admitting: Anesthesiology

## 2023-02-18 DIAGNOSIS — Z87891 Personal history of nicotine dependence: Secondary | ICD-10-CM | POA: Insufficient documentation

## 2023-02-18 DIAGNOSIS — D241 Benign neoplasm of right breast: Secondary | ICD-10-CM | POA: Diagnosis present

## 2023-02-18 DIAGNOSIS — Z01818 Encounter for other preprocedural examination: Secondary | ICD-10-CM

## 2023-02-18 DIAGNOSIS — G4733 Obstructive sleep apnea (adult) (pediatric): Secondary | ICD-10-CM | POA: Insufficient documentation

## 2023-02-18 DIAGNOSIS — N6041 Mammary duct ectasia of right breast: Secondary | ICD-10-CM | POA: Insufficient documentation

## 2023-02-18 HISTORY — DX: Sleep apnea, unspecified: G47.30

## 2023-02-18 HISTORY — PX: BREAST LUMPECTOMY WITH RADIOACTIVE SEED LOCALIZATION: SHX6424

## 2023-02-18 SURGERY — BREAST LUMPECTOMY WITH RADIOACTIVE SEED LOCALIZATION
Anesthesia: General | Site: Breast | Laterality: Right

## 2023-02-18 MED ORDER — ARTIFICIAL TEARS OPHTHALMIC OINT
TOPICAL_OINTMENT | OPHTHALMIC | Status: AC
  Filled 2023-02-18: qty 3.5

## 2023-02-18 MED ORDER — CEFAZOLIN SODIUM-DEXTROSE 2-4 GM/100ML-% IV SOLN
INTRAVENOUS | Status: AC
  Filled 2023-02-18: qty 100

## 2023-02-18 MED ORDER — FENTANYL CITRATE (PF) 100 MCG/2ML IJ SOLN
INTRAMUSCULAR | Status: AC
  Filled 2023-02-18: qty 2

## 2023-02-18 MED ORDER — SCOPOLAMINE 1 MG/3DAYS TD PT72
1.0000 | MEDICATED_PATCH | TRANSDERMAL | Status: DC
  Administered 2023-02-18: 1.5 mg via TRANSDERMAL

## 2023-02-18 MED ORDER — PROPOFOL 10 MG/ML IV BOLUS
INTRAVENOUS | Status: AC
  Filled 2023-02-18: qty 20

## 2023-02-18 MED ORDER — PHENYLEPHRINE 80 MCG/ML (10ML) SYRINGE FOR IV PUSH (FOR BLOOD PRESSURE SUPPORT)
PREFILLED_SYRINGE | INTRAVENOUS | Status: DC | PRN
  Administered 2023-02-18: 80 ug via INTRAVENOUS

## 2023-02-18 MED ORDER — LIDOCAINE 2% (20 MG/ML) 5 ML SYRINGE
INTRAMUSCULAR | Status: DC | PRN
  Administered 2023-02-18: 60 mg via INTRAVENOUS

## 2023-02-18 MED ORDER — BUPIVACAINE-EPINEPHRINE (PF) 0.25% -1:200000 IJ SOLN
INTRAMUSCULAR | Status: AC
  Filled 2023-02-18: qty 90

## 2023-02-18 MED ORDER — CEFAZOLIN SODIUM-DEXTROSE 2-4 GM/100ML-% IV SOLN: 2.0000 g | INTRAVENOUS | Status: DC

## 2023-02-18 MED ORDER — MIDAZOLAM HCL 2 MG/2ML IJ SOLN
INTRAMUSCULAR | Status: DC | PRN
  Administered 2023-02-18: 2 mg via INTRAVENOUS

## 2023-02-18 MED ORDER — AMISULPRIDE (ANTIEMETIC) 5 MG/2ML IV SOLN: 10.0000 mg | Freq: Once | INTRAVENOUS | Status: DC | PRN

## 2023-02-18 MED ORDER — PROPOFOL 10 MG/ML IV BOLUS
INTRAVENOUS | Status: DC | PRN
  Administered 2023-02-18: 150 ug via INTRAVENOUS

## 2023-02-18 MED ORDER — SCOPOLAMINE 1 MG/3DAYS TD PT72
MEDICATED_PATCH | TRANSDERMAL | Status: AC
  Filled 2023-02-18: qty 1

## 2023-02-18 MED ORDER — SODIUM CHLORIDE 0.9 % IV SOLN: INTRAVENOUS | Status: DC | PRN

## 2023-02-18 MED ORDER — ACETAMINOPHEN 500 MG PO TABS
ORAL_TABLET | ORAL | Status: AC
  Filled 2023-02-18: qty 2

## 2023-02-18 MED ORDER — EPHEDRINE SULFATE-NACL 50-0.9 MG/10ML-% IV SOSY
PREFILLED_SYRINGE | INTRAVENOUS | Status: DC | PRN
  Administered 2023-02-18: 5 mg via INTRAVENOUS

## 2023-02-18 MED ORDER — ONDANSETRON HCL 4 MG/2ML IJ SOLN
INTRAMUSCULAR | Status: AC
  Filled 2023-02-18: qty 6

## 2023-02-18 MED ORDER — GABAPENTIN 100 MG PO CAPS
100.0000 mg | ORAL_CAPSULE | ORAL | Status: AC
  Administered 2023-02-18: 100 mg via ORAL

## 2023-02-18 MED ORDER — FENTANYL CITRATE (PF) 100 MCG/2ML IJ SOLN
INTRAMUSCULAR | Status: DC | PRN
  Administered 2023-02-18 (×2): 25 ug via INTRAVENOUS
  Administered 2023-02-18: 50 ug via INTRAVENOUS

## 2023-02-18 MED ORDER — DEXAMETHASONE SODIUM PHOSPHATE 10 MG/ML IJ SOLN
INTRAMUSCULAR | Status: AC
  Filled 2023-02-18: qty 3

## 2023-02-18 MED ORDER — TRAMADOL HCL 50 MG PO TABS: 50.0000 mg | ORAL_TABLET | Freq: Four times a day (QID) | ORAL | 0 refills | Status: DC | PRN

## 2023-02-18 MED ORDER — LIDOCAINE 2% (20 MG/ML) 5 ML SYRINGE
INTRAMUSCULAR | Status: AC
  Filled 2023-02-18: qty 15

## 2023-02-18 MED ORDER — LACTATED RINGERS IV SOLN: INTRAVENOUS | Status: DC

## 2023-02-18 MED ORDER — ONDANSETRON HCL 4 MG/2ML IJ SOLN: 4.0000 mg | Freq: Once | INTRAMUSCULAR | Status: DC | PRN

## 2023-02-18 MED ORDER — PROPOFOL 500 MG/50ML IV EMUL
INTRAVENOUS | Status: AC
  Filled 2023-02-18: qty 50

## 2023-02-18 MED ORDER — ACETAMINOPHEN 500 MG PO TABS
1000.0000 mg | ORAL_TABLET | ORAL | Status: AC
  Administered 2023-02-18: 1000 mg via ORAL

## 2023-02-18 MED ORDER — BUPIVACAINE-EPINEPHRINE (PF) 0.25% -1:200000 IJ SOLN
INTRAMUSCULAR | Status: DC | PRN
  Administered 2023-02-18: 20 mL

## 2023-02-18 MED ORDER — FENTANYL CITRATE (PF) 100 MCG/2ML IJ SOLN: 25.0000 ug | INTRAMUSCULAR | Status: DC | PRN

## 2023-02-18 MED ORDER — MIDAZOLAM HCL 2 MG/2ML IJ SOLN
INTRAMUSCULAR | Status: AC
Start: 2023-02-18 — End: ?
  Filled 2023-02-18: qty 2

## 2023-02-18 MED ORDER — ONDANSETRON HCL 4 MG/2ML IJ SOLN
INTRAMUSCULAR | Status: DC | PRN
  Administered 2023-02-18: 4 mg via INTRAVENOUS

## 2023-02-18 MED ORDER — GABAPENTIN 100 MG PO CAPS
ORAL_CAPSULE | ORAL | Status: AC
  Filled 2023-02-18: qty 1

## 2023-02-18 MED ORDER — CEFAZOLIN SODIUM-DEXTROSE 2-3 GM-%(50ML) IV SOLR
INTRAVENOUS | Status: DC | PRN
  Administered 2023-02-18: 2 g via INTRAVENOUS

## 2023-02-18 MED ORDER — DEXAMETHASONE SODIUM PHOSPHATE 10 MG/ML IJ SOLN
INTRAMUSCULAR | Status: DC | PRN
  Administered 2023-02-18: 10 mg via INTRAVENOUS

## 2023-02-18 SURGICAL SUPPLY — 34 items
APPLIER CLIP 9.375 MED OPEN (MISCELLANEOUS)
BLADE SURG 15 STRL LF DISP TIS (BLADE) ×1 IMPLANT
CANISTER SUC SOCK COL 7IN (MISCELLANEOUS) ×1 IMPLANT
CANISTER SUCT 1200ML W/VALVE (MISCELLANEOUS) ×1 IMPLANT
CHLORAPREP W/TINT 26 (MISCELLANEOUS) ×1 IMPLANT
CLIP APPLIE 9.375 MED OPEN (MISCELLANEOUS) IMPLANT
COVER BACK TABLE 60X90IN (DRAPES) ×1 IMPLANT
COVER MAYO STAND STRL (DRAPES) ×1 IMPLANT
COVER PROBE CYLINDRICAL 5X96 (MISCELLANEOUS) ×1 IMPLANT
DERMABOND ADVANCED .7 DNX12 (GAUZE/BANDAGES/DRESSINGS) ×1 IMPLANT
DRAPE LAPAROSCOPIC ABDOMINAL (DRAPES) ×1 IMPLANT
DRAPE UTILITY XL STRL (DRAPES) ×1 IMPLANT
ELECT COATED BLADE 2.86 ST (ELECTRODE) ×1 IMPLANT
ELECT REM PT RETURN 9FT ADLT (ELECTROSURGICAL) ×1
ELECTRODE REM PT RTRN 9FT ADLT (ELECTROSURGICAL) ×1 IMPLANT
GLOVE BIO SURGEON STRL SZ7.5 (GLOVE) ×2 IMPLANT
GOWN STRL REUS W/ TWL LRG LVL3 (GOWN DISPOSABLE) ×2 IMPLANT
KIT MARKER MARGIN INK (KITS) ×1 IMPLANT
NDL HYPO 25X1 1.5 SAFETY (NEEDLE) IMPLANT
NEEDLE HYPO 25X1 1.5 SAFETY (NEEDLE)
NS IRRIG 1000ML POUR BTL (IV SOLUTION) IMPLANT
PACK BASIN DAY SURGERY FS (CUSTOM PROCEDURE TRAY) ×1 IMPLANT
PENCIL SMOKE EVACUATOR (MISCELLANEOUS) ×1 IMPLANT
SLEEVE SCD COMPRESS KNEE MED (STOCKING) ×1 IMPLANT
SPIKE FLUID TRANSFER (MISCELLANEOUS) IMPLANT
SPONGE T-LAP 18X18 ~~LOC~~+RFID (SPONGE) ×1 IMPLANT
SUT MON AB 4-0 PC3 18 (SUTURE) ×1 IMPLANT
SUT SILK 2 0 SH (SUTURE) IMPLANT
SUT VICRYL 3-0 CR8 SH (SUTURE) ×1 IMPLANT
SYR CONTROL 10ML LL (SYRINGE) IMPLANT
TOWEL GREEN STERILE FF (TOWEL DISPOSABLE) ×1 IMPLANT
TRAY FAXITRON CT DISP (TRAY / TRAY PROCEDURE) ×1 IMPLANT
TUBE CONNECTING 20X1/4 (TUBING) ×1 IMPLANT
YANKAUER SUCT BULB TIP NO VENT (SUCTIONS) IMPLANT

## 2023-02-18 NOTE — Anesthesia Postprocedure Evaluation (Signed)
 Anesthesia Post Note  Patient: Terri Jensen  Procedure(s) Performed: RIGHT BREAST RADIOACTIVE SEED LOCALIZED LUMPECTOMY (Right: Breast)     Patient location during evaluation: PACU Anesthesia Type: General Level of consciousness: awake and alert Pain management: pain level controlled Vital Signs Assessment: post-procedure vital signs reviewed and stable Respiratory status: spontaneous breathing, nonlabored ventilation and respiratory function stable Cardiovascular status: blood pressure returned to baseline and stable Postop Assessment: no apparent nausea or vomiting Anesthetic complications: no   No notable events documented.  Last Vitals:  Vitals:   02/18/23 0915 02/18/23 0924  BP: 122/71 128/71  Pulse: (!) 101 90  Resp: 19 20  Temp:  (!) 36.3 C  SpO2: 98% 99%    Last Pain:  Vitals:   02/18/23 0924  TempSrc: Temporal  PainSc: 0-No pain                 Garnette FORBES [place]

## 2023-02-18 NOTE — H&P (Signed)
 REFERRING PHYSICIAN: Cleotilde Ronal RAMAN., MD PROVIDER: DEWARD GARNETTE NULL, MD MRN: [id number] DOB: 06-Jan-1966 Subjective   Chief Complaint: New Consultation (RT BREAST)  History of Present Illness: Terri Jensen is a 58 y.o. female who is seen today as an office consultation for evaluation of New Consultation (RT BREAST)  We are asked to see the patient in consultation by Dr. Ronal Cleotilde to evaluate her for a papilloma of the right breast. The patient is a 58 year old white female who recently went for a routine screening MRI due to being designated as high risk. She was found to have an 9 mm mass in the outer right breast. This was biopsied and came back as a papilloma. She is otherwise in good health and does not smoke. She does have some obstructive sleep apnea and does take a GLP-1 type medicine  Review of Systems: A complete review of systems was obtained from the patient. I have reviewed this information and discussed as appropriate with the patient. See HPI as well for other ROS.  ROS   Medical History: Past Medical History:  Diagnosis Date  Sleep apnea  Thyroid  disease   Patient Active Problem List  Diagnosis  Papilloma of right breast   Past Surgical History:  Procedure Laterality Date  BREAST IMPLANTS  CESAREAN SECTION  CHOLECYSTECTOMY    Allergies  Allergen Reactions  Codeine Nausea  Sulfur (Bulk) Hives   Current Outpatient Medications on File Prior to Visit  Medication Sig Dispense Refill  cholecalciferol  (VITAMIN D3) 2,000 unit capsule Take by mouth  FLUoxetine  (PROZAC ) 20 MG capsule Take 2 capsules by mouth once daily  levothyroxine  (SYNTHROID ) 25 MCG tablet Take 1 tablet by mouth once daily  rosuvastatin  (CRESTOR ) 10 MG tablet Take 1 tablet by mouth once daily  tirzepatide  (ZEPBOUND ) 10 mg/0.5 mL pen injector Inject 10 mg subcutaneously   No current facility-administered medications on file prior to visit.   Family History  Problem Relation Age of Onset   Stroke Mother  Obesity Mother  High blood pressure (Hypertension) Mother  Hyperlipidemia (Elevated cholesterol) Mother  Diabetes Mother  Breast cancer Mother  Obesity Father  High blood pressure (Hypertension) Father  Hyperlipidemia (Elevated cholesterol) Father  Coronary Artery Disease (Blocked arteries around heart) Father  Diabetes Father  Obesity Brother  High blood pressure (Hypertension) Brother  Hyperlipidemia (Elevated cholesterol) Brother    Social History   Tobacco Use  Smoking Status Former  Types: Cigarettes  Smokeless Tobacco Not on file    Social History   Socioeconomic History  Marital status: Married  Tobacco Use  Smoking status: Former  Types: Cigarettes  Vaping Use  Vaping status: Unknown  Substance and Sexual Activity  Alcohol use: Yes  Drug use: Never   Social Drivers of Health   Received from [employer]  Social Network   Objective:   Vitals:  BP: 117/73  Pulse: 80  Temp: 36.8 C (98.2 F)  SpO2: 98%  Weight: 67.5 kg (148 lb 12.8 oz)  Height: 160 cm (5' 3)  PainSc: 0-No pain   Body mass index is 26.36 kg/m.  Physical Exam Vitals reviewed.  Constitutional:  General: She is not in acute distress. Appearance: Normal appearance.  HENT:  Head: Normocephalic and atraumatic.  Right Ear: External ear normal.  Left Ear: External ear normal.  Nose: Nose normal.  Mouth/Throat:  Mouth: Mucous membranes are moist.  Pharynx: Oropharynx is clear.  Eyes:  General: No scleral icterus. Extraocular Movements: Extraocular movements intact.  Conjunctiva/sclera: Conjunctivae normal.  Pupils:  Pupils are equal, round, and reactive to light.  Cardiovascular:  Rate and Rhythm: Normal rate and regular rhythm.  Pulses: Normal pulses.  Heart sounds: Normal heart sounds.  Pulmonary:  Effort: Pulmonary effort is normal. No respiratory distress.  Breath sounds: Normal breath sounds.  Abdominal:  General: Bowel sounds are normal.   Palpations: Abdomen is soft.  Tenderness: There is no abdominal tenderness.  Musculoskeletal:  General: No swelling, tenderness or deformity. Normal range of motion.  Cervical back: Normal range of motion and neck supple.  Skin: General: Skin is warm and dry.  Coloration: Skin is not jaundiced.  Neurological:  General: No focal deficit present.  Mental Status: She is alert and oriented to person, place, and time.  Psychiatric:  Mood and Affect: Mood normal.  Behavior: Behavior normal.     Breast: The patient has scars on both breast that have healed well. There is no palpable mass in either breast. There is no palpable axillary, supraclavicular, or cervical lymphadenopathy.  Labs, Imaging and Diagnostic Testing:  Assessment and Plan:   Diagnoses and all orders for this visit:  Papilloma of right breast   The patient appears to have a 9 mm papilloma in the outer aspect of the right breast. Because of its appearance and because there is a 5 to 10% chance of missing something more significant and with her being in the high risk category the recommendation is to have this area removed. She would also like to have this done. I have discussed with her in detail the risks and benefits of the operation as well as some of the technical aspects including the use of a radioactive seed for localization and she understands and wishes to proceed.

## 2023-02-18 NOTE — Op Note (Signed)
 02/18/2023  8:47 AM  PATIENT:  Terri Jensen  58 y.o. female  PRE-OPERATIVE DIAGNOSIS:  RIGHT BREAST PAPILLOMA  POST-OPERATIVE DIAGNOSIS:  RIGHT BREAST PAPILLOMA  PROCEDURE:  Procedure(s): RIGHT BREAST RADIOACTIVE SEED LOCALIZED LUMPECTOMY (Right)  SURGEON:  Surgeons and Role:    DEWAINE Curvin Deward DOUGLAS, MD - Primary  PHYSICIAN ASSISTANT:   ASSISTANTS: none   ANESTHESIA:   local and general  EBL:  10 mL   BLOOD ADMINISTERED:none  DRAINS: none   LOCAL MEDICATIONS USED:  MARCAINE      SPECIMEN:  Source of Specimen:  right breast tissue  DISPOSITION OF SPECIMEN:  PATHOLOGY  COUNTS:  YES  TOURNIQUET:  * No tourniquets in log *  DICTATION: .Dragon Dictation  After informed consent was obtained the patient was brought to the operating room and placed in the supine position on the operating table.  After adequate induction of general anesthesia the patient's right breast was prepped with ChloraPrep, allowed to dry, and draped in usual sterile manner.  An appropriate timeout was performed.  Previously an I-125 seed was placed in the lateral central right breast to mark an area of intraductal papilloma.  The neoprobe was set to I-125 in the area of radioactivity was readily identified.  The area around this was infiltrated with quarter percent Marcaine .  A curvilinear incision was then made along the outer aspect of the areola of the right breast with a 15 blade knife.  The incision was carried through the skin and subcutaneous tissue sharply with the electrocautery.  Dissection was then carried towards the radioactive seed under the direction of the neoprobe.  Once I more closely approach the radioactive seed I then removed a circular portion of breast tissue sharply with the electrocautery around the radioactive seed while checking the area of radioactivity frequently.  There appeared to be a lot of scar tissue in this area from her previous reduction.  Once the tissue was removed it was  oriented with the appropriate paint colors.  A specimen radiograph was obtained that showed the clip and seed to be within the specimen.  The specimen was then sent to pathology for further evaluation.  Hemostasis was achieved using the Bovie electrocautery.  The wound was then irrigated with saline and infiltrated with more quarter percent Marcaine .  The deep layer of the incision was closed with layers of interrupted 3-0 Vicryl stitches.  The skin was then closed with interrupted 4-0 Monocryl subcuticular stitches.  Dermabond dressings were applied.  The patient tolerated the procedure well.  At the end of the case all needle sponge and instrument counts were correct.  The patient was then awakened and taken to recovery in stable condition.  PLAN OF CARE: Discharge to home after PACU  PATIENT DISPOSITION:  PACU - hemodynamically stable.   Delay start of Pharmacological VTE agent (>24hrs) due to surgical blood loss or risk of bleeding: not applicable

## 2023-02-18 NOTE — Discharge Instructions (Signed)
**  No Tylenol before 1pm today.   Post Anesthesia Home Care Instructions  Activity: Get plenty of rest for the remainder of the day. A responsible individual must stay with you for 24 hours following the procedure.  For the next 24 hours, DO NOT: -Drive a car -Paediatric nurse -Drink alcoholic beverages -Take any medication unless instructed by your physician -Make any legal decisions or sign important papers.  Meals: Start with liquid foods such as gelatin or soup. Progress to regular foods as tolerated. Avoid greasy, spicy, heavy foods. If nausea and/or vomiting occur, drink only clear liquids until the nausea and/or vomiting subsides. Call your physician if vomiting continues.  Special Instructions/Symptoms: Your throat may feel dry or sore from the anesthesia or the breathing tube placed in your throat during surgery. If this causes discomfort, gargle with warm salt water. The discomfort should disappear within 24 hours.  If you had a scopolamine patch placed behind your ear for the management of post- operative nausea and/or vomiting:  1. The medication in the patch is effective for 72 hours, after which it should be removed.  Wrap patch in a tissue and discard in the trash. Wash hands thoroughly with soap and water. 2. You may remove the patch earlier than 72 hours if you experience unpleasant side effects which may include dry mouth, dizziness or visual disturbances. 3. Avoid touching the patch. Wash your hands with soap and water after contact with the patch.

## 2023-02-18 NOTE — Interval H&P Note (Signed)
 History and Physical Interval Note:  02/18/2023 7:30 AM  Terri Jensen  has presented today for surgery, with the diagnosis of RIGHT BREAST PAPILLOMA.  The various methods of treatment have been discussed with the patient and family. After consideration of risks, benefits and other options for treatment, the patient has consented to  Procedure(s): RIGHT BREAST RADIOACTIVE SEED LOCALIZED LUMPECTOMY (Right) as a surgical intervention.  The patient's history has been reviewed, patient examined, no change in status, stable for surgery.  I have reviewed the patient's chart and labs.  Questions were answered to the patient's satisfaction.     Deward Null III

## 2023-02-18 NOTE — Anesthesia Preprocedure Evaluation (Addendum)
 Anesthesia Evaluation  Patient identified by MRN, date of birth, ID band Patient awake    Reviewed: Allergy & Precautions, NPO status , Patient's Chart, lab work & pertinent test results  History of Anesthesia Complications (+) PONV and history of anesthetic complications  Airway Mallampati: I  TM Distance: >3 FB Neck ROM: Full    Dental  (+) Teeth Intact, Dental Advisory Given, Caps,    Pulmonary sleep apnea and Continuous Positive Airway Pressure Ventilation , former smoker   Pulmonary exam normal breath sounds clear to auscultation       Cardiovascular negative cardio ROS Normal cardiovascular exam Rhythm:Regular Rate:Normal     Neuro/Psych  PSYCHIATRIC DISORDERS Anxiety      Neuromuscular disease    GI/Hepatic negative GI ROS, Neg liver ROS,,,  Endo/Other  Hypothyroidism    Renal/GU negative Renal ROS     Musculoskeletal  (+) Arthritis ,    Abdominal   Peds  Hematology negative hematology ROS (+)   Anesthesia Other Findings Day of surgery medications reviewed with the patient.  RIGHT BREAST PAPILLOMA  Reproductive/Obstetrics                             Anesthesia Physical Anesthesia Plan  ASA: 2  Anesthesia Plan: General   Post-op Pain Management: Tylenol  PO (pre-op)*   Induction: Intravenous  PONV Risk Score and Plan: 4 or greater and Midazolam , Dexamethasone , Ondansetron  and Scopolamine  patch - Pre-op  Airway Management Planned: LMA  Additional Equipment:   Intra-op Plan:   Post-operative Plan: Extubation in OR  Informed Consent: I have reviewed the patients History and Physical, chart, labs and discussed the procedure including the risks, benefits and alternatives for the proposed anesthesia with the patient or authorized representative who has indicated his/her understanding and acceptance.     Dental advisory given  Plan Discussed with: CRNA  Anesthesia Plan  Comments:         Anesthesia Quick Evaluation

## 2023-02-18 NOTE — Anesthesia Procedure Notes (Signed)
 Procedure Name: LMA Insertion Date/Time: 02/18/2023 8:13 AM  Performed by: Leotha Andrez DEL, CRNAPre-anesthesia Checklist: Patient identified, Emergency Drugs available, Suction available and Patient being monitored Patient Re-evaluated:Patient Re-evaluated prior to induction Oxygen Delivery Method: Circle System Utilized Preoxygenation: Pre-oxygenation with 100% oxygen Induction Type: IV induction Ventilation: Mask ventilation without difficulty LMA: LMA inserted LMA Size: 4.0 Number of attempts: 1 Placement Confirmation: positive ETCO2 Tube secured with: Tape Dental Injury: Teeth and Oropharynx as per pre-operative assessment

## 2023-02-18 NOTE — Transfer of Care (Signed)
 Immediate Anesthesia Transfer of Care Note  Patient: Marine Lezotte  Procedure(s) Performed: RIGHT BREAST RADIOACTIVE SEED LOCALIZED LUMPECTOMY (Right: Breast)  Patient Location: PACU  Anesthesia Type:General  Level of Consciousness: awake, alert , and oriented  Airway & Oxygen Therapy: Patient Spontanous Breathing and Patient connected to face mask oxygen  Post-op Assessment: Report given to RN and Post -op Vital signs reviewed and stable  Post vital signs: Reviewed and stable  Last Vitals:  Vitals Value Taken Time  BP 126/66 02/18/23 0853  Temp 36.2 C 02/18/23 0853  Pulse 120 02/18/23 0857  Resp 20 02/18/23 0857  SpO2 100 % 02/18/23 0857  Vitals shown include unfiled device data.  Last Pain:  Vitals:   02/18/23 0853  PainSc: 0-No pain      Patients Stated Pain Goal: 4 (02/18/23 0656)  Complications: Pt's heart rate 130s. Dr. Corinne at bedside. CRNA to get esmolol for PACU. Watching HR.

## 2023-02-19 ENCOUNTER — Encounter (HOSPITAL_BASED_OUTPATIENT_CLINIC_OR_DEPARTMENT_OTHER): Payer: Self-pay | Admitting: General Surgery

## 2023-02-21 LAB — SURGICAL PATHOLOGY

## 2023-03-01 ENCOUNTER — Other Ambulatory Visit (HOSPITAL_BASED_OUTPATIENT_CLINIC_OR_DEPARTMENT_OTHER): Payer: Self-pay

## 2023-03-01 MED ORDER — ZEPBOUND 10 MG/0.5ML ~~LOC~~ SOAJ
10.0000 mg | SUBCUTANEOUS | 1 refills | Status: DC
  Filled 2023-03-01 – 2023-03-29 (×2): qty 2, 28d supply, fill #0

## 2023-03-29 ENCOUNTER — Other Ambulatory Visit (HOSPITAL_BASED_OUTPATIENT_CLINIC_OR_DEPARTMENT_OTHER): Payer: Self-pay

## 2023-04-05 ENCOUNTER — Ambulatory Visit (HOSPITAL_BASED_OUTPATIENT_CLINIC_OR_DEPARTMENT_OTHER): Payer: Commercial Managed Care - PPO | Admitting: Obstetrics & Gynecology

## 2023-04-05 ENCOUNTER — Encounter (HOSPITAL_BASED_OUTPATIENT_CLINIC_OR_DEPARTMENT_OTHER): Payer: Self-pay | Admitting: Obstetrics & Gynecology

## 2023-04-05 VITALS — BP 108/63 | HR 73 | Ht 63.5 in | Wt 144.2 lb

## 2023-04-05 DIAGNOSIS — Z01419 Encounter for gynecological examination (general) (routine) without abnormal findings: Secondary | ICD-10-CM

## 2023-04-05 DIAGNOSIS — Z780 Asymptomatic menopausal state: Secondary | ICD-10-CM | POA: Diagnosis not present

## 2023-04-05 DIAGNOSIS — N904 Leukoplakia of vulva: Secondary | ICD-10-CM | POA: Diagnosis not present

## 2023-04-05 DIAGNOSIS — L900 Lichen sclerosus et atrophicus: Secondary | ICD-10-CM

## 2023-04-05 DIAGNOSIS — Z803 Family history of malignant neoplasm of breast: Secondary | ICD-10-CM

## 2023-04-05 NOTE — Progress Notes (Signed)
 ANNUAL EXAM Patient name: Terri Jensen MRN [id number]  Date of birth: 1965-12-24 Chief Complaint:   AEX  History of Present Illness:   Terri Jensen is a 58 y.o. G2P2 Caucasian female being seen today for a routine annual exam.  Doing well.  Denies vaginal bleeding.  Had a lumpectomy this past year.    Current complaints: none  Patient's last menstrual period was 09/08/2013.   Last pap 03/04/2022. Results were: NILM w/ HRHPV negative. H/O abnormal pap: no Last mammogram: 04/21/2022. Results were: normal. Family h/o breast cancer: yes , mother Last colonoscopy: 04/05/2016, follow up 10 years. Results were: normal. Family h/o colorectal cancer: yes, paternal grandmother     04/05/2023    3:47 PM 04/26/2022   11:08 AM 03/04/2022    9:37 AM 01/26/2021    2:23 PM 05/23/2020    9:05 AM  Depression screen PHQ 2/9  Decreased Interest 0 0 0 0 0  Down, Depressed, Hopeless 0 0 0 0 0  PHQ - 2 Score 0 0 0 0 0     Review of Systems:   Pertinent items are noted in HPI  Denies any headaches, blurred vision, fatigue, shortness of breath, chest pain, abdominal pain, abnormal vaginal discharge/itching/odor/irritation, problems with periods, bowel movements, urination, or intercourse unless otherwise stated above. Pertinent History Reviewed:  Reviewed past medical,surgical, social and family history  Reviewed problem list, medications and allergies. Physical Assessment:   Vitals:   04/05/23 1546  BP: 108/63  Pulse: 73  Weight: 144 lb 3.2 oz (65.4 kg)  Height: 5' 3.5" (1.613 m)  Body mass index is 25.14 kg/m.        Physical Examination:   General appearance - well appearing, and in no distress  Mental status - alert, oriented to person, place, and time  Psych:  She has a normal mood and affect  Skin - warm and dry, normal color, no suspicious lesions noted  Chest - effort normal, all lung fields clear to auscultation bilaterally  Heart - normal rate and regular  rhythm  Neck:  midline trachea, no thyromegaly or nodules  Breasts - breasts appear normal, no suspicious masses, no skin or nipple changes or  axillary nodes  Abdomen - soft, nontender, nondistended, no masses or organomegaly  Pelvic - VULVA: normal appearing vulva with no masses, tenderness or lesions   VAGINA: normal appearing vagina with normal color and discharge, no lesions   CERVIX: normal appearing cervix without discharge or lesions, no CMT  Thin prep pap not obtained  UTERUS: uterus is felt to be normal size, shape, consistency and nontender   ADNEXA: No adnexal masses or tenderness noted.  Rectal - normal rectal, good sphincter tone, no masses felt.  Extremities:  No swelling or varicosities noted  Chaperone present for exam, Ina Homes, CMA  Assessment & Plan:  1. Well woman exam with routine gynecological exam (Primary) - Pap smear neg 02/2022 - Mammogram 04/2022 - Colonoscopy 2018.  Follow up 10 years. - Bone mineral density guidelines reviewed - lab work done with PCP, Dr. Mardelle Matte - vaccines reviewed/updated  2. Postmenopausal - not HRT  3. Lichen sclerosus - exam is normal today  4. Family history of breast cancer - discussed MRI.  She would like to not have one this year and consider again next year.    Meds: No orders of the defined types were placed in this encounter.   Follow-up: Return in about 1 year (around 04/04/2024).  Jerene Bears,  MD 04/05/2023 4:16 PM

## 2023-04-20 ENCOUNTER — Other Ambulatory Visit (HOSPITAL_BASED_OUTPATIENT_CLINIC_OR_DEPARTMENT_OTHER): Payer: Self-pay

## 2023-04-27 ENCOUNTER — Encounter: Payer: BC Managed Care – PPO | Admitting: Family Medicine

## 2023-05-02 ENCOUNTER — Ambulatory Visit (INDEPENDENT_AMBULATORY_CARE_PROVIDER_SITE_OTHER): Admitting: Family Medicine

## 2023-05-02 ENCOUNTER — Encounter: Payer: Self-pay | Admitting: Family Medicine

## 2023-05-02 VITALS — BP 125/85 | HR 77 | Temp 97.8°F | Ht 63.5 in | Wt 143.0 lb

## 2023-05-02 DIAGNOSIS — R7989 Other specified abnormal findings of blood chemistry: Secondary | ICD-10-CM

## 2023-05-02 DIAGNOSIS — M791 Myalgia, unspecified site: Secondary | ICD-10-CM

## 2023-05-02 DIAGNOSIS — E038 Other specified hypothyroidism: Secondary | ICD-10-CM

## 2023-05-02 DIAGNOSIS — Z0000 Encounter for general adult medical examination without abnormal findings: Secondary | ICD-10-CM

## 2023-05-02 DIAGNOSIS — E669 Obesity, unspecified: Secondary | ICD-10-CM | POA: Diagnosis not present

## 2023-05-02 DIAGNOSIS — F411 Generalized anxiety disorder: Secondary | ICD-10-CM

## 2023-05-02 DIAGNOSIS — Z91018 Allergy to other foods: Secondary | ICD-10-CM | POA: Insufficient documentation

## 2023-05-02 DIAGNOSIS — Z0001 Encounter for general adult medical examination with abnormal findings: Secondary | ICD-10-CM

## 2023-05-02 DIAGNOSIS — Z6824 Body mass index (BMI) 24.0-24.9, adult: Secondary | ICD-10-CM | POA: Diagnosis not present

## 2023-05-02 DIAGNOSIS — E782 Mixed hyperlipidemia: Secondary | ICD-10-CM | POA: Diagnosis not present

## 2023-05-02 LAB — CBC WITH DIFFERENTIAL/PLATELET
Basophils Absolute: 0 10*3/uL (ref 0.0–0.1)
Basophils Relative: 0.8 % (ref 0.0–3.0)
Eosinophils Absolute: 0 10*3/uL (ref 0.0–0.7)
Eosinophils Relative: 0.6 % (ref 0.0–5.0)
HCT: 41 % (ref 36.0–46.0)
Hemoglobin: 14 g/dL (ref 12.0–15.0)
Lymphocytes Relative: 38.5 % (ref 12.0–46.0)
Lymphs Abs: 1.6 10*3/uL (ref 0.7–4.0)
MCHC: 34.2 g/dL (ref 30.0–36.0)
MCV: 96.5 fl (ref 78.0–100.0)
Monocytes Absolute: 0.3 10*3/uL (ref 0.1–1.0)
Monocytes Relative: 7.1 % (ref 3.0–12.0)
Neutro Abs: 2.3 10*3/uL (ref 1.4–7.7)
Neutrophils Relative %: 53 % (ref 43.0–77.0)
Platelets: 223 10*3/uL (ref 150.0–400.0)
RBC: 4.25 Mil/uL (ref 3.87–5.11)
RDW: 12.4 % (ref 11.5–15.5)
WBC: 4.3 10*3/uL (ref 4.0–10.5)

## 2023-05-02 LAB — COMPREHENSIVE METABOLIC PANEL
ALT: 26 U/L (ref 0–35)
AST: 22 U/L (ref 0–37)
Albumin: 4.7 g/dL (ref 3.5–5.2)
Alkaline Phosphatase: 75 U/L (ref 39–117)
BUN: 17 mg/dL (ref 6–23)
CO2: 27 meq/L (ref 19–32)
Calcium: 9.9 mg/dL (ref 8.4–10.5)
Chloride: 103 meq/L (ref 96–112)
Creatinine, Ser: 0.71 mg/dL (ref 0.40–1.20)
GFR: 93.78 mL/min (ref >60.00)
Glucose, Bld: 99 mg/dL (ref 70–99)
Potassium: 4 meq/L (ref 3.5–5.1)
Sodium: 138 meq/L (ref 135–145)
Total Bilirubin: 0.6 mg/dL (ref 0.2–1.2)
Total Protein: 6.9 g/dL (ref 6.0–8.3)

## 2023-05-02 LAB — LIPID PANEL
Cholesterol: 176 mg/dL (ref 0–200)
HDL: 79.8 mg/dL (ref >39.00)
LDL Cholesterol: 82 mg/dL (ref 0–99)
NonHDL: 95.72
Total CHOL/HDL Ratio: 2
Triglycerides: 71 mg/dL (ref 0.0–149.0)
VLDL: 14.2 mg/dL (ref 0.0–40.0)

## 2023-05-02 LAB — CK: Total CK: 60 U/L (ref 7–177)

## 2023-05-02 LAB — VITAMIN D 25 HYDROXY (VIT D DEFICIENCY, FRACTURES): VITD: 56.72 ng/mL (ref 30.00–100.00)

## 2023-05-02 LAB — TSH: TSH: 2.12 u[IU]/mL (ref 0.35–5.50)

## 2023-05-02 MED ORDER — EPINEPHRINE 0.3 MG/0.3ML IJ SOAJ: 0.3000 mg | INTRAMUSCULAR | 2 refills | Status: AC | PRN

## 2023-05-02 NOTE — Progress Notes (Signed)
 Subjective  Chief Complaint  Patient presents with   Annual Exam    Pt here for annual exam with questions about medications - pt non fasting    Anxiety   Hyperlipidemia    HPI: Terri Jensen is a 58 y.o. female who presents to [facility] [place] today for a Female Wellness Visit.  She also has the concerns and/or needs as listed above in the chief complaint. These will be addressed in addition to the Health Maintenance Visit.   Wellness Visit: annual visit with health maintenance review and exam Health maintenance: Sees gynecology.  Screens are current and normal.  Colonoscopy is current.  Immunizations are up-to-date.  Healthy lifestyle.  Eye exam current. Chronic disease management visit and/or acute problem visit: Generalized anxiety disorder well-controlled on Prozac 20 mg daily.  No concerns. History of subclinical hypothyroidism treated with levothyroxine 50 mcg daily a number of years.  She has had significant weight loss and dose was currently decreased to levothyroxine 25 mcg daily about 5 months ago due to symptoms of hyperthyroidism.  Levels has not been rechecked.  She currently feels well without symptoms of high or low thyroid.  I reviewed numbers over the last several years.  Highest TSH was 6.3. Obesity: Resolved.  Now on maintenance therapy and Zepbound.  Total weight loss around 80 pounds.  Feels well.  Exercising.  Managed by weight loss center Complains of myalgias upper extremities and thighs.  Worse over the last 5 months.  Question if related to statin.  She has been on statins for a number of years and does have history of myalgias in the past.  Had been tolerating Crestor for several years without any symptoms.  No rashes or weakness.  No trauma.  She is not fasting today for recheck. History of vitamin D deficiency on oral supplements. Assessment  1. Encounter for well adult exam with abnormal findings   2. GAD (generalized anxiety  disorder)   3. Mixed hyperlipidemia   4. Low vitamin D level   5. Myalgia   6. Subclinical hypothyroidism   7. Obesity (BMI 35.0-39.9 without comorbidity)   8. History of food anaphylaxis      Plan  Female Wellness Visit: Age appropriate Health Maintenance and Prevention measures were discussed with patient. Included topics are cancer screening recommendations, ways to keep healthy (see AVS) including dietary and exercise recommendations, regular eye and dental care, use of seat belts, and avoidance of moderate alcohol use and tobacco use.  BMI: discussed patient's BMI and encouraged positive lifestyle modifications to help get to or maintain a target BMI. HM needs and immunizations were addressed and ordered. See below for orders. See HM and immunization section for updates. Routine labs and screening tests ordered including cmp, cbc and lipids where appropriate. Discussed recommendations regarding Vit D and calcium supplementation (see AVS)  Chronic disease f/u and/or acute problem visit: (deemed necessary to be done in addition to the wellness visit): Anxiety disorder: Well-controlled on Prozac 20 mg daily.  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  Check CK and liver enzymes.  Hold Lipitor for 2 to 4 weeks.  If symptoms do not resolve she will let me know.  I suspect myalgias will improve, then can start slowly with 5 to 10 mg of Crestor 2-3 times per week with the additional use of co-Q10 nightly.  Education given. Obesity resolved.  On maintenance therapy of Zepbound Subclinical hypothyroidism: Will recheck TSH and 25 mcg levothyroxine daily.  She is clinically euthyroid.  Will likely be able to stop medication and recheck in 8 weeks.  Patient agrees Recheck vitamin D levels Shellfish allergy with history of anaphylaxis: EpiPen renewed  Follow up: 1 year for complete physical  Orders Placed This Encounter  Procedures   VITAMIN D 25 Hydroxy (Vit-D Deficiency,  Fractures)   CBC with Differential/Platelet   Comprehensive metabolic panel   Lipid panel   TSH   CK (Creatine Kinase)   Meds ordered this encounter  Medications   EPINEPHrine 0.3 mg/0.3 mL IJ SOAJ injection    Sig: Inject 0.3 mg into the muscle as needed for anaphylaxis.    Dispense:  1 each    Refill:  2       Body mass index is 24.93 kg/m. Wt Readings from Last 3 Encounters:  05/02/23 143 lb (64.9 kg)  04/05/23 144 lb 3.2 oz (65.4 kg)  02/18/23 146 lb 6.2 oz (66.4 kg)     Patient Active Problem List   Diagnosis Date Noted Date Diagnosed   Family history of premature CAD 04/21/2021     Priority: High    Dad 45; mom 35s; + HTN, +HLD and smokers Calcium score of 0, 2023    Obstructive sleep apnea on CPAP 04/21/2021     Priority: High   GAD (generalized anxiety disorder) 03/16/2019     Priority: High    Stable on Prozac for years    Subclinical hypothyroidism 08/05/2016     Priority: High   Obesity (BMI 35.0-39.9 without comorbidity) 04/05/2016     Priority: High    Resolved with 80 pound weight loss on Zepbound.    Mixed hyperlipidemia 04/05/2016     Priority: High    Some h/o myalgias on statin. Has tolerated crestor 10 but adjusting 2025 due to mm pain. Adding coq10    Lichen sclerosus of female genitalia 12/20/2018     Priority: Medium    Lumbar radiculopathy 02/21/2018     Priority: Medium    Inflammatory arthritis 01/03/2018     Priority: Medium     Possible. Never formally verified; monitored. 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    Health Maintenance  Topic Date Due   COVID-19 Vaccine (7 - 2024-25 season) 05/18/2023 (Originally 10/17/2022)   MAMMOGRAM  12/15/2023   Colonoscopy  02/17/2026   DTaP/Tdap/Td (2 - Td or Tdap) 08/05/2026   Cervical Cancer Screening (HPV/Pap Cotest)  03/05/2027    INFLUENZA VACCINE  Completed   Hepatitis C Screening  Completed   Zoster Vaccines- Shingrix  Completed   HPV VACCINES  Aged Out   HIV Screening  Discontinued   Immunization History  Administered Date(s) Administered   Influenza Inj Mdck Quad Pf 12/05/2021   Influenza,inj,Quad PF,6+ Mos 11/09/2016   Influenza,inj,quad, With Preservative 10/16/2016   Influenza-Unspecified 02/15/2013, 11/15/2016, 11/15/2017, 11/16/2019, 11/14/2020, 11/16/2022   PFIZER(Purple Top)SARS-COV-2 Vaccination 04/14/2019, 05/05/2019, 01/24/2020   Pfizer(Comirnaty)Fall Seasonal Vaccine 12 years and older 12/05/2021   Tdap 08/04/2016   Unspecified SARS-COV-2 Vaccination 04/16/2019, 05/17/2019   Zoster Recombinant(Shingrix) 04/21/2021, 07/28/2021   We updated and reviewed the patient's past history in detail and it is documented below. Allergies: Patient  reports current alcohol use of about 6.0 - 8.0 standard drinks of alcohol per week. Past Medical  History Patient  has a past medical history of Allergy (1999), Anxiety (1993), History of kidney stones, History of shingles, HLD (hyperlipidemia), Hypothyroidism, Lichen sclerosus, Low vitamin D level, Perimenopause, PONV (postoperative nausea and vomiting), Postmenopausal HRT (hormone replacement therapy) (03/16/2019), Prediabetes, and Sleep apnea. Past Surgical History Patient  has a past surgical history that includes Novasure ablation (2005); Cholecystectomy (1987); Wisdom tooth extraction; Cesarean section (1993); Radioactive seed guided excisional breast biopsy (Left, 05/04/2017); Augmentation mammaplasty (Bilateral); Breast excisional biopsy (Left, 2019); Lithotripsy; Breast biopsy (Right, 12/24/2022); Breast biopsy (02/17/2023); Breast lumpectomy with radioactive seed localization (Right, 02/18/2023); and Cosmetic surgery (1985, 1993, 2023). Social History   Socioeconomic History   Marital status: Married    Spouse name: Not on file   Number of children: 2    Years of education: Not on file   Highest education level: Bachelor's degree (e.g., BA, AB, BS)  Occupational History   Occupation: Admin    Employer: [employer]  Tobacco Use   Smoking status: Former    Current packs/day: 0.00    Average packs/day: 1 pack/day for 7.0 years (7.0 ttl pk-yrs)    Types: Cigarettes    Start date: 04/23/1983    Quit date: 04/23/1990    Years since quitting: 33.0   Smokeless tobacco: Never  Vaping Use   Vaping status: Never Used  Substance and Sexual Activity   Alcohol use: Yes    Alcohol/week: 6.0 - 8.0 standard drinks of alcohol    Types: 4 - 6 Glasses of wine, 2 Standard drinks or equivalent per week    Comment: social   Drug use: No   Sexual activity: Yes    Birth control/protection: Post-menopausal, Other-see comments    Comment: VAS  Other Topics Concern   Not on file  Social History Narrative   Not on file   Social Drivers of Health   Financial Resource Strain: Low Risk  (05/01/2023)   Overall Financial Resource Strain (CARDIA)    Difficulty of Paying Living Expenses: Not hard at all  Food Insecurity: No Food Insecurity (05/01/2023)   Hunger Vital Sign    Worried About Running Out of Food in the Last Year: Never true    Ran Out of Food in the Last Year: Never true  Transportation Needs: No Transportation Needs (05/01/2023)   PRAPARE - Administrator, Civil Service (Medical): No    Lack of Transportation (Non-Medical): No  Physical Activity: Unknown (05/01/2023)   Exercise Vital Sign    Days of Exercise per Week: 0 days    Minutes of Exercise per Session: Not on file  Stress: No Stress Concern Present (05/01/2023)   [employer] of Occupational Health - Occupational Stress Questionnaire    Feeling of Stress : Not at all  Social Connections: Socially Integrated (05/01/2023)   Social Connection and Isolation Panel [NHANES]    Frequency of Communication with Friends and Family: More than three times a week    Frequency of Social  Gatherings with Friends and Family: Three times a week    Attends Religious Services: More than 4 times per year    Active Member of Clubs or Organizations: Yes    Attends Engineer, structural: More than 4 times per year    Marital Status: Married   Family History  Problem Relation Age of Onset   Heart disease Mother    COPD Mother        Emphysema   Thyroid disease Mother    Hypertension Mother  Diabetes Mother    Stroke Mother    Breast cancer Mother 69   Cancer Mother    Hyperlipidemia Mother    Obesity Mother    Heart disease Father    COPD Father        Emphysema   Hypertension Father    Diabetes Father    Kidney disease Father    Liver disease Father    Hyperlipidemia Father    Obesity Father    Hypertension Brother    Hyperlipidemia Brother    Obesity Brother    Cancer Paternal Grandmother 39       colon   Colon cancer Paternal Grandmother     Review of Systems: Constitutional: negative for fever or malaise Ophthalmic: negative for photophobia, double vision or loss of vision Cardiovascular: negative for chest pain, dyspnea on exertion, or new LE swelling Respiratory: negative for SOB or persistent cough Gastrointestinal: negative for abdominal pain, change in bowel habits or melena Genitourinary: negative for dysuria or gross hematuria, no abnormal uterine bleeding or disharge Musculoskeletal: negative for new gait disturbance or muscular weakness Integumentary: negative for new or persistent rashes, no breast lumps Neurological: negative for TIA or stroke symptoms Psychiatric: negative for SI or delusions Allergic/Immunologic: negative for hives  Patient Care Team    Relationship Specialty Notifications Start End  Willow Ora, MD PCP - General Family Medicine  03/16/19   Jodelle Red, MD PCP - Cardiology Cardiology  10/22/21   Charna Elizabeth, MD Consulting Physician Gastroenterology  01/03/18   Jacqlyn Krauss, MD Referring  Physician Dermatology  01/03/18   Gibson Ramp, MD Consulting Physician Ophthalmology  01/03/18   Silverio Lay, MD Consulting Physician Obstetrics and Gynecology  01/03/18   Casimer Lanius, MD Consulting Physician Rheumatology  08/06/18     Objective  Vitals: BP 125/85   Pulse 77   Temp 97.8 F (36.6 C)   Ht 5' 3.5" (1.613 m)   Wt 143 lb (64.9 kg)   LMP 09/08/2013   SpO2 100%   BMI 24.93 kg/m  General:  Well developed, well nourished, no acute distress  Psych:  Alert and orientedx3,normal mood and affect HEENT:  Normocephalic, atraumatic, non-icteric sclera, PERRL, supple neck without adenopathy, mass or thyromegaly Cardiovascular:  Normal S1, S2, RRR without gallop, rub or murmur Respiratory:  Good breath sounds bilaterally, CTAB with normal respiratory effort Gastrointestinal: normal bowel sounds, soft, non-tender, no noted masses. No HSM MSK: no deformities, contusions. Joints are without erythema or swelling.  Skin:  Warm, no rashes or suspicious lesions noted Neurologic:    Mental status is normal. Gross motor and sensory exams are normal. Normal gait. No tremor But weird right   Commons side effects, risks, benefits, and alternatives for medications and treatment plan prescribed today were discussed, and the patient expressed understanding of the given instructions. Patient is instructed to call or message via MyChart if he/she has any questions or concerns regarding our treatment plan. No barriers to understanding were identified. We discussed Red Flag symptoms and signs in detail. Patient expressed understanding regarding what to do in case of urgent or emergency type symptoms.  Medication list was reconciled, printed and provided to the patient in AVS. Patient instructions and summary information was reviewed with the patient as documented in the AVS. This note was prepared with assistance of Dragon voice recognition software. Occasional wrong-word or sound-a-like  substitutions may have occurred due to the inherent limitations of voice recognition software .

## 2023-05-02 NOTE — Patient Instructions (Signed)
 Please return in 12 months for your annual complete physical; please come fasting.   I will release your lab results to you on your MyChart account with further instructions. You may see the results before I do, but when I review them I will send you a message with my report or have my assistant call you if things need to be discussed. Please reply to my message with any questions. Thank you!   If you have any questions or concerns, please don't hesitate to send me a message via MyChart or call the office at [phone]. Thank you for visiting with [place] today! It's our pleasure caring for you.   VISIT SUMMARY:  Terri Jensen, during your visit, we discussed your muscle pain, thyroid medication, and vaccination needs. We also addressed your request for an EpiPen refill and reviewed your general health maintenance, including the MMR vaccine and colonoscopy schedule.  YOUR PLAN:  -MUSCLE PAIN: Your muscle pain may be due to Crestor, a medication you are taking for cholesterol. We will stop Crestor for 3-4 weeks to see if your muscle pain improves. We will also check your muscle enzyme levels. If your symptoms improve, we will restart Crestor at a lower dose of 5 mg, 2-3 days a week, and add coenzyme Q10 at night to help with muscle pain.  -HYPOTHYROIDISM: Hypothyroidism is when your thyroid gland does not produce enough thyroid hormone. Since your symptoms improved after reducing your thyroid medication, we will check your thyroid function with a blood test. If your thyroid function is stable, we may consider discontinuing your thyroid medication. We will reassess your thyroid function in 8-12 weeks if the medication is discontinued.  -EPINEPHRINE AUTO-INJECTOR (EPIPEN) REFILL: You need a refill for your EpiPen due to your shellfish allergy and upcoming travel. We have ordered a refill for your EpiPen.  -GENERAL HEALTH MAINTENANCE: We reviewed your need for the MMR vaccine and your colonoscopy schedule. We may  consider testing your immunity to MMR if needed. Your next colonoscopy is scheduled for 2027.  INSTRUCTIONS:  Please stop taking Crestor for the next 3-4 weeks and monitor your muscle pain. We will check your muscle enzyme levels during this period. If your muscle pain improves, we will restart Crestor at a lower dose. Also, please get your thyroid function tested as ordered. If your thyroid function is stable, we may discontinue your thyroid medication and reassess in 8-12 weeks. Your EpiPen refill has been ordered. Consider MMR titer testing if indicated, and remember your next colonoscopy is due in 2027.

## 2023-05-03 ENCOUNTER — Encounter: Payer: Self-pay | Admitting: Family Medicine

## 2023-05-03 NOTE — Progress Notes (Signed)
 Labs reviewed.  The 10-year ASCVD risk score (Arnett DK, et al., 2019) is: 1.7%   Values used to calculate the score:     Age: 58 years     Sex: Female     Is Non-Hispanic African American: No     Diabetic: No     Tobacco smoker: No     Systolic Blood Pressure: 125 mmHg     Is BP treated: No     HDL Cholesterol: 79.8 mg/dL     Total Cholesterol: 176 mg/dL

## 2023-07-20 ENCOUNTER — Encounter: Payer: Self-pay | Admitting: Family Medicine

## 2023-07-21 ENCOUNTER — Other Ambulatory Visit: Payer: Self-pay

## 2023-07-21 MED ORDER — ROSUVASTATIN CALCIUM 5 MG PO TABS: 5.0000 mg | ORAL_TABLET | Freq: Every day | ORAL | 3 refills | Status: AC

## 2023-11-06 ENCOUNTER — Other Ambulatory Visit: Payer: Self-pay | Admitting: Family Medicine

## 2023-11-06 DIAGNOSIS — F411 Generalized anxiety disorder: Secondary | ICD-10-CM

## 2023-11-07 NOTE — Telephone Encounter (Signed)
 05/02/2023 LOV  10//15/2024 fill date  180/3 refills

## 2023-11-30 ENCOUNTER — Ambulatory Visit: Admitting: Pulmonary Disease

## 2023-11-30 ENCOUNTER — Encounter: Payer: Self-pay | Admitting: Pulmonary Disease

## 2023-11-30 VITALS — BP 122/70 | HR 78 | Temp 98.2°F | Ht 64.0 in | Wt 139.4 lb

## 2023-11-30 DIAGNOSIS — G4733 Obstructive sleep apnea (adult) (pediatric): Secondary | ICD-10-CM | POA: Diagnosis not present

## 2023-11-30 NOTE — Progress Notes (Signed)
 Terri Jensen    [id number]    09/27/1965  Primary Care Physician:Andy, Lavern CROME, MD  Referring Physician: Jodie Lavern CROME, MD [address] [place],  [place] [address]  Chief complaint:   Patient with poor sleep, snoring, multiple awakenings In for follow-up  HPI:  She did have a sleep study done about 5 years ago that showed mild to moderate sleep apnea is what she remembers Repeat study 07/20/2020 shows mild obstructive sleep apnea with mild oxygen desaturations She has been using CPAP  Has been using CPAP nightly not having significant problems Does have some mask leak with a new mask  Continues to benefit from CPAP use  Has lost close to 100 pounds  Wakes up feeling rested in the mornings  Usually tries to go to bed between 10 and 11 Takes a 10 to 15 minutes to fall asleep about 3 awakenings Final wake up time about 630  Both parents snored Reformed smoker No regular exercises   Outpatient Encounter Medications as of 11/30/2023  Medication Sig   [employer] SUNSCREEN SPF 30 EX apply   EPINEPHrine  0.3 mg/0.3 mL IJ SOAJ injection Inject 0.3 mg into the muscle as needed for anaphylaxis.   FLUoxetine  (PROZAC ) 20 MG capsule TAKE 2 CAPSULES BY MOUTH DAILY   Multiple Vitamins-Minerals (MULTIVITAMIN ADULT) CHEW Chew 2 each by mouth daily.   rosuvastatin  (CRESTOR ) 10 MG tablet TAKE 1 TABLET BY MOUTH DAILY   tirzepatide  (ZEPBOUND ) 12.5 MG/0.5ML Pen Inject 12.5 mg into the skin once a week.   tretinoin (RETIN-A) 0.1 % cream APPLY TO AFFECTED AREA EVERY DAY   Vitamin D , Cholecalciferol , 50 MCG (2000 UT) CAPS Take by mouth.   rosuvastatin  (CRESTOR ) 5 MG tablet Take 1 tablet (5 mg total) by mouth daily. (Patient not taking: Reported on 11/30/2023)   triamcinolone  ointment (KENALOG ) 0.1 % Apply topically as needed for itching up to twice weekly. (Patient not taking: Reported on 11/30/2023)   No facility-administered encounter medications on file as of 11/30/2023.     Allergies as of 11/30/2023 - Review Complete 11/30/2023  Allergen Reaction Noted   Shellfish allergy Anaphylaxis 07/29/2011   Tape Rash 07/29/2011   Iodine  04/21/2021   Codeine Nausea And Vomiting 04/05/2016   Sulfa antibiotics Rash 07/23/2016    Past Medical History:  Diagnosis Date   Allergy 1999   Shellfish   Anxiety 1993   History of kidney stones    History of shingles    HLD (hyperlipidemia)    Hypothyroidism    Lichen sclerosus    Low vitamin D  level    Perimenopause    PONV (postoperative nausea and vomiting)    Postmenopausal HRT (hormone replacement therapy) 03/16/2019   Prediabetes    Sleep apnea    uses CPAP nightly    Past Surgical History:  Procedure Laterality Date   AUGMENTATION MAMMAPLASTY Bilateral    and removed later   BREAST BIOPSY Right 12/24/2022   BREAST BIOPSY  02/17/2023   MM RT RADIOACTIVE SEED LOC MAMMO GUIDE 02/17/2023 [facility]   BREAST EXCISIONAL BIOPSY Left 2019   BREAST LUMPECTOMY WITH RADIOACTIVE SEED LOCALIZATION Right 02/18/2023   Procedure: RIGHT BREAST RADIOACTIVE SEED LOCALIZED LUMPECTOMY;  Surgeon: Curvin Deward MOULD, MD;  Location: [facility];  Service: General;  Laterality: Right;   CESAREAN SECTION  1993   CHOLECYSTECTOMY  1987   COSMETIC SURGERY  1985, 1993, 2023   implants in, replaced, and removed  LITHOTRIPSY     NOVASURE ABLATION  2005   RADIOACTIVE SEED GUIDED EXCISIONAL BREAST BIOPSY Left 05/04/2017   Procedure: RADIOACTIVE SEED GUIDED EXCISIONAL BREAST BIOPSY;  Surgeon: Ebbie Cough, MD;  Location: [facility] OR;  Service: General;  Laterality: Left;   WISDOM TOOTH EXTRACTION      Family History  Problem Relation Age of Onset   Heart disease Mother    COPD Mother        Emphysema   Thyroid  disease Mother    Hypertension Mother    Diabetes Mother    Stroke Mother    Breast cancer Mother 28   Cancer Mother    Hyperlipidemia Mother    Obesity Mother    Heart disease Father    COPD  Father        Emphysema   Hypertension Father    Diabetes Father    Kidney disease Father    Liver disease Father    Hyperlipidemia Father    Obesity Father    Hypertension Brother    Hyperlipidemia Brother    Obesity Brother    Cancer Paternal Grandmother 13       colon   Colon cancer Paternal Grandmother     Social History   Socioeconomic History   Marital status: Married    Spouse name: Not on file   Number of children: 2   Years of education: Not on file   Highest education level: Bachelor's degree (e.g., BA, AB, BS)  Occupational History   Occupation: Admin    Employer: [employer]  Tobacco Use   Smoking status: Former    Current packs/day: 0.00    Average packs/day: 1 pack/day for 7.0 years (7.0 ttl pk-yrs)    Types: Cigarettes    Start date: 04/23/1983    Quit date: 04/23/1990    Years since quitting: 33.6   Smokeless tobacco: Never  Vaping Use   Vaping status: Never Used  Substance and Sexual Activity   Alcohol use: Yes    Alcohol/week: 6.0 - 8.0 standard drinks of alcohol    Types: 4 - 6 Glasses of wine, 2 Standard drinks or equivalent per week    Comment: social   Drug use: No   Sexual activity: Yes    Birth control/protection: Post-menopausal, Other-see comments    Comment: VAS  Other Topics Concern   Not on file  Social History Narrative   Not on file   Social Drivers of Health   Financial Resource Strain: Low Risk  (05/01/2023)   Overall Financial Resource Strain (CARDIA)    Difficulty of Paying Living Expenses: Not hard at all  Food Insecurity: No Food Insecurity (05/01/2023)   Hunger Vital Sign    Worried About Running Out of Food in the Last Year: Never true    Ran Out of Food in the Last Year: Never true  Transportation Needs: No Transportation Needs (05/01/2023)   PRAPARE - Administrator, Civil Service (Medical): No    Lack of Transportation (Non-Medical): No  Physical Activity: Unknown (05/01/2023)   Exercise Vital Sign    Days of  Exercise per Week: 0 days    Minutes of Exercise per Session: Not on file  Stress: No Stress Concern Present (05/01/2023)   [employer] of Occupational Health - Occupational Stress Questionnaire    Feeling of Stress : Not at all  Social Connections: Socially Integrated (05/01/2023)   Social Connection and Isolation Panel    Frequency of Communication with Friends and Family:  More than three times a week    Frequency of Social Gatherings with Friends and Family: Three times a week    Attends Religious Services: More than 4 times per year    Active Member of Clubs or Organizations: Yes    Attends Banker Meetings: More than 4 times per year    Marital Status: Married  Catering manager Violence: Unknown (05/21/2021)   Received from [facility]   HITS    Physically Hurt: Not on file    Insult or Talk Down To: Not on file    Threaten Physical Harm: Not on file    Scream or Curse: Not on file    Review of Systems  Constitutional:  Positive for fatigue.  Respiratory:  Negative for shortness of breath.   Psychiatric/Behavioral:  Positive for sleep disturbance.     There were no vitals filed for this visit.    Physical Exam Constitutional:      Appearance: She is obese.  HENT:     Head: Normocephalic.     Mouth/Throat:     Mouth: Mucous membranes are moist.     Comments: Macroglossia, crowded oropharynx, Mallampati 3 Eyes:     Pupils: Pupils are equal, round, and reactive to light.  Cardiovascular:     Rate and Rhythm: Normal rate.     Heart sounds: No murmur heard.    No friction rub.  Pulmonary:     Effort: No respiratory distress.     Breath sounds: No stridor. No wheezing.  Musculoskeletal:     Cervical back: No rigidity or tenderness.  Neurological:     Mental Status: She is alert.  Psychiatric:        Mood and Affect: Mood normal.   Epworth Sleepiness Scale of 10  Data Reviewed: Compliance data reviewed showing 100% compliance [employer]  5-15 Residual AHI of 0.7  Assessment:  Mild obstructive sleep apnea - Tolerating CPAP well --Significant issues  Continues to benefit from CPAP  For mask issues - Encouraged to reach out to DME to see whether she can go back to the previous mask he was tolerating better   Plan/Recommendations: Continue CPAP nightly  Follow-up a year from now  When she gets to her ideal weight, we can repeat a sleep study to see whether she still has significant sleep disordered breathing  Encouraged to call with significant concerns     Jennet Epley MD [facility] 11/30/2023, 3:58 PM  CC: Jodie Lavern CROME, MD

## 2023-11-30 NOTE — Patient Instructions (Signed)
 I will see you a year from now  Continue using your CPAP on a nightly basis  Reach out to your DME company to see if they will send you a replacement to your mask  Call us  with significant concerns  Download from your machine shows that is working well

## 2024-04-09 ENCOUNTER — Ambulatory Visit (HOSPITAL_BASED_OUTPATIENT_CLINIC_OR_DEPARTMENT_OTHER): Payer: Commercial Managed Care - PPO | Admitting: Obstetrics & Gynecology

## 2024-05-04 ENCOUNTER — Encounter: Admitting: Family Medicine
